# Patient Record
Sex: Female | Born: 1985 | Race: White | Hispanic: No | Marital: Married | Smoking: Never smoker
Health system: Southern US, Community
[De-identification: ages and names within clinical notes are randomized; demographics above are authoritative.]

## PROBLEM LIST (undated history)

## (undated) DIAGNOSIS — Z0000 Encounter for general adult medical examination without abnormal findings: Secondary | ICD-10-CM

## (undated) DIAGNOSIS — T7840XA Allergy, unspecified, initial encounter: Secondary | ICD-10-CM

## (undated) DIAGNOSIS — Z8619 Personal history of other infectious and parasitic diseases: Secondary | ICD-10-CM

## (undated) DIAGNOSIS — O30043 Twin pregnancy, dichorionic/diamniotic, third trimester: Secondary | ICD-10-CM

## (undated) HISTORY — DX: Personal history of other infectious and parasitic diseases: Z86.19

## (undated) HISTORY — DX: Allergy, unspecified, initial encounter: T78.40XA

## (undated) HISTORY — DX: Encounter for general adult medical examination without abnormal findings: Z00.00

## (undated) HISTORY — PX: DENTAL SURGERY: SHX609

---

## 2008-12-12 HISTORY — PX: BACK SURGERY: SHX140

## 2014-09-24 ENCOUNTER — Encounter: Payer: Self-pay | Admitting: Podiatry

## 2014-09-24 ENCOUNTER — Ambulatory Visit (INDEPENDENT_AMBULATORY_CARE_PROVIDER_SITE_OTHER): Payer: 59 | Admitting: Podiatry

## 2014-09-24 ENCOUNTER — Ambulatory Visit (INDEPENDENT_AMBULATORY_CARE_PROVIDER_SITE_OTHER): Payer: 59

## 2014-09-24 VITALS — BP 118/72 | HR 72 | Resp 13 | Ht 69.0 in | Wt 140.0 lb

## 2014-09-24 DIAGNOSIS — Q828 Other specified congenital malformations of skin: Secondary | ICD-10-CM

## 2014-09-24 DIAGNOSIS — M79673 Pain in unspecified foot: Secondary | ICD-10-CM

## 2014-09-24 NOTE — Progress Notes (Signed)
   Subjective:    Patient ID: Teresa Reeves, female    DOB: 05/13/1986, 28 y.o.   MRN: [id number]  HPI Comments: [name], 28 year old female, presents the office today for bilateral plantar "warts". She states they been ongoing for approximately 3 years. She's tried multiple over-the-counter therapy is without any resolution. She states that she has pain over the areas upon weightbearing and activity. Denies any history of injury or trauma to the area. No other complaints at this time.  Foot Pain      Review of Systems  All other systems reviewed and are negative.      Objective:   Physical Exam AAO x3, NAD DP/PT pulses palpable bilaterally, CRT less than 3 seconds Protective sensation intact with Simms Weinstein monofilament, vibratory sensation intact Bilateral submetatarsal 5 pinpoint hyperkeratotic lesions. Upon debridement there is no capillary bleeding or other evidence of verruca. There is hyperkeratotic tissue deep within the sites. No drainage, purulence, fluctuance, crepitus, erythema. No overlying edema or increased warmth. Submetatarsal 2 hyperkeratotic lesions. Mild decrease in dorsiflexion of the first MTPJ bilaterally. MMT 5/5, ROM WNL No calf pain, swelling, warmth.       Assessment & Plan:  28 year old female with bilateral submetatarsal 5 porokeratosis. -Treatment options discussed including alternatives, risks, complications. -Bilateral submetatarsal 5 lesions sharply debrided without complications. There is no evidence of rubra. -Salinocaine was applied with a surrounding pad and an overlying dressing. Patient was instructed on when to remove the bandages. Also discussed care for once the bandages are removed. -Followup as needed. Call the office with any questions, concerns, change in symptoms. Monitor for any signs or symptoms of infection and directed to call the office immediately if any are to occur.

## 2014-10-06 ENCOUNTER — Other Ambulatory Visit (HOSPITAL_COMMUNITY): Payer: Self-pay | Admitting: Obstetrics and Gynecology

## 2014-10-06 DIAGNOSIS — N979 Female infertility, unspecified: Secondary | ICD-10-CM

## 2014-10-09 ENCOUNTER — Ambulatory Visit (HOSPITAL_COMMUNITY)
Admission: RE | Admit: 2014-10-09 | Discharge: 2014-10-09 | Disposition: A | Discharge status: Home / Self Care | Payer: 59 | Source: Ambulatory Visit | Attending: Obstetrics and Gynecology | Admitting: Obstetrics and Gynecology

## 2014-10-09 DIAGNOSIS — N979 Female infertility, unspecified: Secondary | ICD-10-CM | POA: Diagnosis not present

## 2014-10-09 MED ORDER — IOHEXOL 300 MG/ML  SOLN
20.0000 mL | Freq: Once | INTRAMUSCULAR | Status: AC | PRN
  Administered 2014-10-09: 20 mL

## 2014-12-11 ENCOUNTER — Encounter: Payer: Self-pay | Admitting: Family Medicine

## 2014-12-11 ENCOUNTER — Ambulatory Visit (INDEPENDENT_AMBULATORY_CARE_PROVIDER_SITE_OTHER): Payer: 59 | Admitting: Family Medicine

## 2014-12-11 VITALS — BP 114/68 | HR 63 | Temp 98.3°F | Ht 69.75 in | Wt 133.4 lb

## 2014-12-11 DIAGNOSIS — M545 Low back pain: Secondary | ICD-10-CM

## 2014-12-11 DIAGNOSIS — T7840XA Allergy, unspecified, initial encounter: Secondary | ICD-10-CM

## 2014-12-11 DIAGNOSIS — Z0000 Encounter for general adult medical examination without abnormal findings: Secondary | ICD-10-CM

## 2014-12-11 DIAGNOSIS — Z23 Encounter for immunization: Secondary | ICD-10-CM

## 2014-12-11 DIAGNOSIS — R238 Other skin changes: Secondary | ICD-10-CM

## 2014-12-11 DIAGNOSIS — Z8619 Personal history of other infectious and parasitic diseases: Secondary | ICD-10-CM | POA: Insufficient documentation

## 2014-12-11 DIAGNOSIS — R233 Spontaneous ecchymoses: Secondary | ICD-10-CM

## 2014-12-11 DIAGNOSIS — M79605 Pain in left leg: Secondary | ICD-10-CM

## 2014-12-11 HISTORY — DX: Allergy, unspecified, initial encounter: T78.40XA

## 2014-12-11 HISTORY — DX: Encounter for general adult medical examination without abnormal findings: Z00.00

## 2014-12-11 LAB — COMPREHENSIVE METABOLIC PANEL
ALT: 31 U/L (ref 0–35)
AST: 34 U/L (ref 0–37)
Albumin: 4.7 g/dL (ref 3.5–5.2)
Alkaline Phosphatase: 101 U/L (ref 39–117)
BUN: 16 mg/dL (ref 6–23)
CALCIUM: 9.4 mg/dL (ref 8.4–10.5)
CHLORIDE: 106 meq/L (ref 96–112)
CO2: 28 mEq/L (ref 19–32)
CREATININE: 0.6 mg/dL (ref 0.4–1.2)
GFR: 121.79 mL/min (ref >60.00)
Glucose, Bld: 76 mg/dL (ref 70–99)
POTASSIUM: 3.8 meq/L (ref 3.5–5.1)
SODIUM: 138 meq/L (ref 135–145)
Total Bilirubin: 1 mg/dL (ref 0.2–1.2)
Total Protein: 7.5 g/dL (ref 6.0–8.3)

## 2014-12-11 LAB — LIPID PANEL
Cholesterol: 168 mg/dL (ref 0–200)
HDL: 73 mg/dL (ref >39.00)
LDL Cholesterol: 87 mg/dL (ref 0–99)
NONHDL: 95
Total CHOL/HDL Ratio: 2
Triglycerides: 40 mg/dL (ref 0.0–149.0)
VLDL: 8 mg/dL (ref 0.0–40.0)

## 2014-12-11 LAB — CBC
HCT: 41 % (ref 36.0–46.0)
HEMOGLOBIN: 13.9 g/dL (ref 12.0–15.0)
MCHC: 33.9 g/dL (ref 30.0–36.0)
MCV: 98.4 fl (ref 78.0–100.0)
Platelets: 231 10*3/uL (ref 150.0–400.0)
RBC: 4.16 Mil/uL (ref 3.87–5.11)
RDW: 12.4 % (ref 11.5–15.5)
WBC: 3.4 10*3/uL — AB (ref 4.0–10.5)

## 2014-12-11 LAB — TSH: TSH: 2.04 u[IU]/mL (ref 0.35–4.50)

## 2014-12-11 NOTE — Assessment & Plan Note (Signed)
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 Tolerable. Patient signed a ROR for [place] MRI on her s/p her surgery, he said it looked good. No c/o today

## 2014-12-11 NOTE — Assessment & Plan Note (Signed)
Encouraged Zyrte, nasal saline prn, no symptoms today

## 2014-12-11 NOTE — Progress Notes (Signed)
Pre visit review using our clinic review tool, if applicable. No additional management support is needed unless otherwise documented below in the visit note. 

## 2014-12-11 NOTE — Patient Instructions (Signed)
Preventive Care for Adults A healthy lifestyle and preventive care can promote health and wellness. Preventive health guidelines for women include the following key practices.  A routine yearly physical is a good way to check with your health care provider about your health and preventive screening. It is a chance to share any concerns and updates on your health and to receive a thorough exam.  Visit your dentist for a routine exam and preventive care every 6 months. Brush your teeth twice a day and floss once a day. Good oral hygiene prevents tooth decay and gum disease.  The frequency of eye exams is based on your age, health, family medical history, use of contact lenses, and other factors. Follow your health care provider's recommendations for frequency of eye exams.  Eat a healthy diet. Foods like vegetables, fruits, whole grains, low-fat dairy products, and lean protein foods contain the nutrients you need without too many calories. Decrease your intake of foods high in solid fats, added sugars, and salt. Eat the right amount of calories for you.Get information about a proper diet from your health care provider, if necessary.  Regular physical exercise is one of the most important things you can do for your health. Most adults should get at least 150 minutes of moderate-intensity exercise (any activity that increases your heart rate and causes you to sweat) each week. In addition, most adults need muscle-strengthening exercises on 2 or more days a week.  Maintain a healthy weight. The body mass index (BMI) is a screening tool to identify possible weight problems. It provides an estimate of body fat based on height and weight. Your health care provider can find your BMI and can help you achieve or maintain a healthy weight.For adults 20 years and older:  A BMI below 18.5 is considered underweight.  A BMI of 18.5 to 24.9 is normal.  A BMI of 25 to 29.9 is considered overweight.  A BMI of  30 and above is considered obese.  Maintain normal blood lipids and cholesterol levels by exercising and minimizing your intake of saturated fat. Eat a balanced diet with plenty of fruit and vegetables. Blood tests for lipids and cholesterol should begin at age 76 and be repeated every 5 years. If your lipid or cholesterol levels are high, you are over 50, or you are at high risk for heart disease, you may need your cholesterol levels checked more frequently.Ongoing high lipid and cholesterol levels should be treated with medicines if diet and exercise are not working.  If you smoke, find out from your health care provider how to quit. If you do not use tobacco, do not start.  Lung cancer screening is recommended for adults aged 22-80 years who are at high risk for developing lung cancer because of a history of smoking. A yearly low-dose CT scan of the lungs is recommended for people who have at least a 30-pack-year history of smoking and are a current smoker or have quit within the past 15 years. A pack year of smoking is smoking an average of 1 pack of cigarettes a day for 1 year (for example: 1 pack a day for 30 years or 2 packs a day for 15 years). Yearly screening should continue until the smoker has stopped smoking for at least 15 years. Yearly screening should be stopped for people who develop a health problem that would prevent them from having lung cancer treatment.  If you are pregnant, do not drink alcohol. If you are breastfeeding,  be very cautious about drinking alcohol. If you are not pregnant and choose to drink alcohol, do not have more than 1 drink per day. One drink is considered to be 12 ounces (355 mL) of beer, 5 ounces (148 mL) of wine, or 1.5 ounces (44 mL) of liquor.  Avoid use of street drugs. Do not share needles with anyone. Ask for help if you need support or instructions about stopping the use of drugs.  High blood pressure causes heart disease and increases the risk of  stroke. Your blood pressure should be checked at least every 1 to 2 years. Ongoing high blood pressure should be treated with medicines if weight loss and exercise do not work.  If you are 75-52 years old, ask your health care provider if you should take aspirin to prevent strokes.  Diabetes screening involves taking a blood sample to check your fasting blood sugar level. This should be done once every 3 years, after age 15, if you are within normal weight and without risk factors for diabetes. Testing should be considered at a younger age or be carried out more frequently if you are overweight and have at least 1 risk factor for diabetes.  Breast cancer screening is essential preventive care for women. You should practice "breast self-awareness." This means understanding the normal appearance and feel of your breasts and may include breast self-examination. Any changes detected, no matter how small, should be reported to a health care provider. Women in their 58s and 30s should have a clinical breast exam (CBE) by a health care provider as part of a regular health exam every 1 to 3 years. After age 16, women should have a CBE every year. Starting at age 53, women should consider having a mammogram (breast X-ray test) every year. Women who have a family history of breast cancer should talk to their health care provider about genetic screening. Women at a high risk of breast cancer should talk to their health care providers about having an MRI and a mammogram every year.  Breast cancer gene (BRCA)-related cancer risk assessment is recommended for women who have family members with BRCA-related cancers. BRCA-related cancers include breast, ovarian, tubal, and peritoneal cancers. Having family members with these cancers may be associated with an increased risk for harmful changes (mutations) in the breast cancer genes BRCA1 and BRCA2. Results of the assessment will determine the need for genetic counseling and  BRCA1 and BRCA2 testing.  Routine pelvic exams to screen for cancer are no longer recommended for nonpregnant women who are considered low risk for cancer of the pelvic organs (ovaries, uterus, and vagina) and who do not have symptoms. Ask your health care provider if a screening pelvic exam is right for you.  If you have had past treatment for cervical cancer or a condition that could lead to cancer, you need Pap tests and screening for cancer for at least 20 years after your treatment. If Pap tests have been discontinued, your risk factors (such as having a new sexual partner) need to be reassessed to determine if screening should be resumed. Some women have medical problems that increase the chance of getting cervical cancer. In these cases, your health care provider may recommend more frequent screening and Pap tests.  The HPV test is an additional test that may be used for cervical cancer screening. The HPV test looks for the virus that can cause the cell changes on the cervix. The cells collected during the Pap test can be  tested for HPV. The HPV test could be used to screen women aged 30 years and older, and should be used in women of any age who have unclear Pap test results. After the age of 30, women should have HPV testing at the same frequency as a Pap test.  Colorectal cancer can be detected and often prevented. Most routine colorectal cancer screening begins at the age of 50 years and continues through age 75 years. However, your health care provider may recommend screening at an earlier age if you have risk factors for colon cancer. On a yearly basis, your health care provider may provide home test kits to check for hidden blood in the stool. Use of a small camera at the end of a tube, to directly examine the colon (sigmoidoscopy or colonoscopy), can detect the earliest forms of colorectal cancer. Talk to your health care provider about this at age 50, when routine screening begins. Direct  exam of the colon should be repeated every 5-10 years through age 75 years, unless early forms of pre-cancerous polyps or small growths are found.  People who are at an increased risk for hepatitis B should be screened for this virus. You are considered at high risk for hepatitis B if:  You were born in a country where hepatitis B occurs often. Talk with your health care provider about which countries are considered high risk.  Your parents were born in a high-risk country and you have not received a shot to protect against hepatitis B (hepatitis B vaccine).  You have HIV or AIDS.  You use needles to inject street drugs.  You live with, or have sex with, someone who has hepatitis B.  You get hemodialysis treatment.  You take certain medicines for conditions like cancer, organ transplantation, and autoimmune conditions.  Hepatitis C blood testing is recommended for all people born from 1945 through 1965 and any individual with known risks for hepatitis C.  Practice safe sex. Use condoms and avoid high-risk sexual practices to reduce the spread of sexually transmitted infections (STIs). STIs include gonorrhea, chlamydia, syphilis, trichomonas, herpes, HPV, and human immunodeficiency virus (HIV). Herpes, HIV, and HPV are viral illnesses that have no cure. They can result in disability, cancer, and death.  You should be screened for sexually transmitted illnesses (STIs) including gonorrhea and chlamydia if:  You are sexually active and are younger than 24 years.  You are older than 24 years and your health care provider tells you that you are at risk for this type of infection.  Your sexual activity has changed since you were last screened and you are at an increased risk for chlamydia or gonorrhea. Ask your health care provider if you are at risk.  If you are at risk of being infected with HIV, it is recommended that you take a prescription medicine daily to prevent HIV infection. This is  called preexposure prophylaxis (PrEP). You are considered at risk if:  You are a heterosexual woman, are sexually active, and are at increased risk for HIV infection.  You take drugs by injection.  You are sexually active with a partner who has HIV.  Talk with your health care provider about whether you are at high risk of being infected with HIV. If you choose to begin PrEP, you should first be tested for HIV. You should then be tested every 3 months for as long as you are taking PrEP.  Osteoporosis is a disease in which the bones lose minerals and strength   with aging. This can result in serious bone fractures or breaks. The risk of osteoporosis can be identified using a bone density scan. Women ages 65 years and over and women at risk for fractures or osteoporosis should discuss screening with their health care providers. Ask your health care provider whether you should take a calcium supplement or vitamin D to reduce the rate of osteoporosis.  Menopause can be associated with physical symptoms and risks. Hormone replacement therapy is available to decrease symptoms and risks. You should talk to your health care provider about whether hormone replacement therapy is right for you.  Use sunscreen. Apply sunscreen liberally and repeatedly throughout the day. You should seek shade when your shadow is shorter than you. Protect yourself by wearing long sleeves, pants, a wide-brimmed hat, and sunglasses year round, whenever you are outdoors.  Once a month, do a whole body skin exam, using a mirror to look at the skin on your back. Tell your health care provider of new moles, moles that have irregular borders, moles that are larger than a pencil eraser, or moles that have changed in shape or color.  Stay current with required vaccines (immunizations).  Influenza vaccine. All adults should be immunized every year.  Tetanus, diphtheria, and acellular pertussis (Td, Tdap) vaccine. Pregnant women should  receive 1 dose of Tdap vaccine during each pregnancy. The dose should be obtained regardless of the length of time since the last dose. Immunization is preferred during the 27th-36th week of gestation. An adult who has not previously received Tdap or who does not know her vaccine status should receive 1 dose of Tdap. This initial dose should be followed by tetanus and diphtheria toxoids (Td) booster doses every 10 years. Adults with an unknown or incomplete history of completing a 3-dose immunization series with Td-containing vaccines should begin or complete a primary immunization series including a Tdap dose. Adults should receive a Td booster every 10 years.  Varicella vaccine. An adult without evidence of immunity to varicella should receive 2 doses or a second dose if she has previously received 1 dose. Pregnant females who do not have evidence of immunity should receive the first dose after pregnancy. This first dose should be obtained before leaving the health care facility. The second dose should be obtained 4-8 weeks after the first dose.  Human papillomavirus (HPV) vaccine. Females aged 13-26 years who have not received the vaccine previously should obtain the 3-dose series. The vaccine is not recommended for use in pregnant females. However, pregnancy testing is not needed before receiving a dose. If a female is found to be pregnant after receiving a dose, no treatment is needed. In that case, the remaining doses should be delayed until after the pregnancy. Immunization is recommended for any person with an immunocompromised condition through the age of 26 years if she did not get any or all doses earlier. During the 3-dose series, the second dose should be obtained 4-8 weeks after the first dose. The third dose should be obtained 24 weeks after the first dose and 16 weeks after the second dose.  Zoster vaccine. One dose is recommended for adults aged 60 years or older unless certain conditions are  present.  Measles, mumps, and rubella (MMR) vaccine. Adults born before 1957 generally are considered immune to measles and mumps. Adults born in 1957 or later should have 1 or more doses of MMR vaccine unless there is a contraindication to the vaccine or there is laboratory evidence of immunity to   each of the three diseases. A routine second dose of MMR vaccine should be obtained at least 28 days after the first dose for students attending postsecondary schools, health care workers, or international travelers. People who received inactivated measles vaccine or an unknown type of measles vaccine during 1963-1967 should receive 2 doses of MMR vaccine. People who received inactivated mumps vaccine or an unknown type of mumps vaccine before 1979 and are at high risk for mumps infection should consider immunization with 2 doses of MMR vaccine. For females of childbearing age, rubella immunity should be determined. If there is no evidence of immunity, females who are not pregnant should be vaccinated. If there is no evidence of immunity, females who are pregnant should delay immunization until after pregnancy. Unvaccinated health care workers born before 1957 who lack laboratory evidence of measles, mumps, or rubella immunity or laboratory confirmation of disease should consider measles and mumps immunization with 2 doses of MMR vaccine or rubella immunization with 1 dose of MMR vaccine.  Pneumococcal 13-valent conjugate (PCV13) vaccine. When indicated, a person who is uncertain of her immunization history and has no record of immunization should receive the PCV13 vaccine. An adult aged 19 years or older who has certain medical conditions and has not been previously immunized should receive 1 dose of PCV13 vaccine. This PCV13 should be followed with a dose of pneumococcal polysaccharide (PPSV23) vaccine. The PPSV23 vaccine dose should be obtained at least 8 weeks after the dose of PCV13 vaccine. An adult aged 19  years or older who has certain medical conditions and previously received 1 or more doses of PPSV23 vaccine should receive 1 dose of PCV13. The PCV13 vaccine dose should be obtained 1 or more years after the last PPSV23 vaccine dose.  Pneumococcal polysaccharide (PPSV23) vaccine. When PCV13 is also indicated, PCV13 should be obtained first. All adults aged 65 years and older should be immunized. An adult younger than age 65 years who has certain medical conditions should be immunized. Any person who resides in a nursing home or long-term care facility should be immunized. An adult smoker should be immunized. People with an immunocompromised condition and certain other conditions should receive both PCV13 and PPSV23 vaccines. People with human immunodeficiency virus (HIV) infection should be immunized as soon as possible after diagnosis. Immunization during chemotherapy or radiation therapy should be avoided. Routine use of PPSV23 vaccine is not recommended for American Indians, [place] Natives, or people younger than 65 years unless there are medical conditions that require PPSV23 vaccine. When indicated, people who have unknown immunization and have no record of immunization should receive PPSV23 vaccine. One-time revaccination 5 years after the first dose of PPSV23 is recommended for people aged 19-64 years who have chronic kidney failure, nephrotic syndrome, asplenia, or immunocompromised conditions. People who received 1-2 doses of PPSV23 before age 65 years should receive another dose of PPSV23 vaccine at age 65 years or later if at least 5 years have passed since the previous dose. Doses of PPSV23 are not needed for people immunized with PPSV23 at or after age 65 years.  Meningococcal vaccine. Adults with asplenia or persistent complement component deficiencies should receive 2 doses of quadrivalent meningococcal conjugate (MenACWY-D) vaccine. The doses should be obtained at least 2 months apart.  Microbiologists working with certain meningococcal bacteria, military recruits, people at risk during an outbreak, and people who travel to or live in countries with a high rate of meningitis should be immunized. A first-year college student up through age   21 years who is living in a residence hall should receive a dose if she did not receive a dose on or after her 16th birthday. Adults who have certain high-risk conditions should receive one or more doses of vaccine.  Hepatitis A vaccine. Adults who wish to be protected from this disease, have certain high-risk conditions, work with hepatitis A-infected animals, work in hepatitis A research labs, or travel to or work in countries with a high rate of hepatitis A should be immunized. Adults who were previously unvaccinated and who anticipate close contact with an international adoptee during the first 60 days after arrival in the [place] from a country with a high rate of hepatitis A should be immunized.  Hepatitis B vaccine. Adults who wish to be protected from this disease, have certain high-risk conditions, may be exposed to blood or other infectious body fluids, are household contacts or sex partners of hepatitis B positive people, are clients or workers in certain care facilities, or travel to or work in countries with a high rate of hepatitis B should be immunized.  Haemophilus influenzae type b (Hib) vaccine. A previously unvaccinated person with asplenia or sickle cell disease or having a scheduled splenectomy should receive 1 dose of Hib vaccine. Regardless of previous immunization, a recipient of a hematopoietic stem cell transplant should receive a 3-dose series 6-12 months after her successful transplant. Hib vaccine is not recommended for adults with HIV infection. Preventive Services / Frequency Ages 64 to 68 years  Blood pressure check.** / Every 1 to 2 years.  Lipid and cholesterol check.** / Every 5 years beginning at age  22.  Clinical breast exam.** / Every 3 years for women in their 88s and 53s.  BRCA-related cancer risk assessment.** / For women who have family members with a BRCA-related cancer (breast, ovarian, tubal, or peritoneal cancers).  Pap test.** / Every 2 years from ages 90 through 51. Every 3 years starting at age 21 through age 56 or 3 with a history of 3 consecutive normal Pap tests.  HPV screening.** / Every 3 years from ages 24 through ages 1 to 46 with a history of 3 consecutive normal Pap tests.  Hepatitis C blood test.** / For any individual with known risks for hepatitis C.  Skin self-exam. / Monthly.  Influenza vaccine. / Every year.  Tetanus, diphtheria, and acellular pertussis (Tdap, Td) vaccine.** / Consult your health care provider. Pregnant women should receive 1 dose of Tdap vaccine during each pregnancy. 1 dose of Td every 10 years.  Varicella vaccine.** / Consult your health care provider. Pregnant females who do not have evidence of immunity should receive the first dose after pregnancy.  HPV vaccine. / 3 doses over 6 months, if 72 and younger. The vaccine is not recommended for use in pregnant females. However, pregnancy testing is not needed before receiving a dose.  Measles, mumps, rubella (MMR) vaccine.** / You need at least 1 dose of MMR if you were born in 1957 or later. You may also need a 2nd dose. For females of childbearing age, rubella immunity should be determined. If there is no evidence of immunity, females who are not pregnant should be vaccinated. If there is no evidence of immunity, females who are pregnant should delay immunization until after pregnancy.  Pneumococcal 13-valent conjugate (PCV13) vaccine.** / Consult your health care provider.  Pneumococcal polysaccharide (PPSV23) vaccine.** / 1 to 2 doses if you smoke cigarettes or if you have certain conditions.  Meningococcal vaccine.** /  1 dose if you are age 19 to 21 years and a first-year college  student living in a residence hall, or have one of several medical conditions, you need to get vaccinated against meningococcal disease. You may also need additional booster doses.  Hepatitis A vaccine.** / Consult your health care provider.  Hepatitis B vaccine.** / Consult your health care provider.  Haemophilus influenzae type b (Hib) vaccine.** / Consult your health care provider. Ages 40 to 64 years  Blood pressure check.** / Every 1 to 2 years.  Lipid and cholesterol check.** / Every 5 years beginning at age 20 years.  Lung cancer screening. / Every year if you are aged 55-80 years and have a 30-pack-year history of smoking and currently smoke or have quit within the past 15 years. Yearly screening is stopped once you have quit smoking for at least 15 years or develop a health problem that would prevent you from having lung cancer treatment.  Clinical breast exam.** / Every year after age 40 years.  BRCA-related cancer risk assessment.** / For women who have family members with a BRCA-related cancer (breast, ovarian, tubal, or peritoneal cancers).  Mammogram.** / Every year beginning at age 40 years and continuing for as long as you are in good health. Consult with your health care provider.  Pap test.** / Every 3 years starting at age 30 years through age 65 or 70 years with a history of 3 consecutive normal Pap tests.  HPV screening.** / Every 3 years from ages 30 years through ages 65 to 70 years with a history of 3 consecutive normal Pap tests.  Fecal occult blood test (FOBT) of stool. / Every year beginning at age 50 years and continuing until age 75 years. You may not need to do this test if you get a colonoscopy every 10 years.  Flexible sigmoidoscopy or colonoscopy.** / Every 5 years for a flexible sigmoidoscopy or every 10 years for a colonoscopy beginning at age 50 years and continuing until age 75 years.  Hepatitis C blood test.** / For all people born from 1945 through  1965 and any individual with known risks for hepatitis C.  Skin self-exam. / Monthly.  Influenza vaccine. / Every year.  Tetanus, diphtheria, and acellular pertussis (Tdap/Td) vaccine.** / Consult your health care provider. Pregnant women should receive 1 dose of Tdap vaccine during each pregnancy. 1 dose of Td every 10 years.  Varicella vaccine.** / Consult your health care provider. Pregnant females who do not have evidence of immunity should receive the first dose after pregnancy.  Zoster vaccine.** / 1 dose for adults aged 60 years or older.  Measles, mumps, rubella (MMR) vaccine.** / You need at least 1 dose of MMR if you were born in 1957 or later. You may also need a 2nd dose. For females of childbearing age, rubella immunity should be determined. If there is no evidence of immunity, females who are not pregnant should be vaccinated. If there is no evidence of immunity, females who are pregnant should delay immunization until after pregnancy.  Pneumococcal 13-valent conjugate (PCV13) vaccine.** / Consult your health care provider.  Pneumococcal polysaccharide (PPSV23) vaccine.** / 1 to 2 doses if you smoke cigarettes or if you have certain conditions.  Meningococcal vaccine.** / Consult your health care provider.  Hepatitis A vaccine.** / Consult your health care provider.  Hepatitis B vaccine.** / Consult your health care provider.  Haemophilus influenzae type b (Hib) vaccine.** / Consult your health care provider. Ages 65   years and over  Blood pressure check.** / Every 1 to 2 years.  Lipid and cholesterol check.** / Every 5 years beginning at age 22 years.  Lung cancer screening. / Every year if you are aged 73-80 years and have a 30-pack-year history of smoking and currently smoke or have quit within the past 15 years. Yearly screening is stopped once you have quit smoking for at least 15 years or develop a health problem that would prevent you from having lung cancer  treatment.  Clinical breast exam.** / Every year after age 4 years.  BRCA-related cancer risk assessment.** / For women who have family members with a BRCA-related cancer (breast, ovarian, tubal, or peritoneal cancers).  Mammogram.** / Every year beginning at age 40 years and continuing for as long as you are in good health. Consult with your health care provider.  Pap test.** / Every 3 years starting at age 9 years through age 34 or 91 years with 3 consecutive normal Pap tests. Testing can be stopped between 65 and 70 years with 3 consecutive normal Pap tests and no abnormal Pap or HPV tests in the past 10 years.  HPV screening.** / Every 3 years from ages 57 years through ages 64 or 45 years with a history of 3 consecutive normal Pap tests. Testing can be stopped between 65 and 70 years with 3 consecutive normal Pap tests and no abnormal Pap or HPV tests in the past 10 years.  Fecal occult blood test (FOBT) of stool. / Every year beginning at age 15 years and continuing until age 17 years. You may not need to do this test if you get a colonoscopy every 10 years.  Flexible sigmoidoscopy or colonoscopy.** / Every 5 years for a flexible sigmoidoscopy or every 10 years for a colonoscopy beginning at age 86 years and continuing until age 71 years.  Hepatitis C blood test.** / For all people born from [birth year] through 1967 and any individual with known risks for hepatitis C.  Osteoporosis screening.** / A one-time screening for women ages 83 years and over and women at risk for fractures or osteoporosis.  Skin self-exam. / Monthly.  Influenza vaccine. / Every year.  Tetanus, diphtheria, and acellular pertussis (Tdap/Td) vaccine.** / 1 dose of Td every 10 years.  Varicella vaccine.** / Consult your health care provider.  Zoster vaccine.** / 1 dose for adults aged 61 years or older.  Pneumococcal 13-valent conjugate (PCV13) vaccine.** / Consult your health care provider.  Pneumococcal  polysaccharide (PPSV23) vaccine.** / 1 dose for all adults aged 28 years and older.  Meningococcal vaccine.** / Consult your health care provider.  Hepatitis A vaccine.** / Consult your health care provider.  Hepatitis B vaccine.** / Consult your health care provider.  Haemophilus influenzae type b (Hib) vaccine.** / Consult your health care provider. ** Family history and personal history of risk and conditions may change your health care provider's recommendations. Document Released: 01/24/2002 Document Revised: 04/14/2014 Document Reviewed: 04/25/2011 [facility] Patient Information 2015 [place], [place]. This information is not intended to replace advice given to you by your health care provider. Make sure you discuss any questions you have with your health care provider.

## 2014-12-11 NOTE — Assessment & Plan Note (Signed)
Patient encouraged to maintain heart healthy diet, regular exercise, adequate sleep. Consider daily probiotics. Fasting labs obtained today. C/o some easy bruising but no recent change will check a CBC. She is considering a pregnancy has been following with [facility] but her Dr, Dr Kathie RhodesHaygod does not do deliveries any longer. She will pursue care and is taking a prenatal vitamin.

## 2014-12-11 NOTE — Progress Notes (Signed)
Reino BellisMelanie Kensinger  [id number] 11/13/1986 12/11/2014      Progress Note-Follow Up  Subjective  Chief Complaint  Chief Complaint  Patient presents with  . Establish Care    pt wanrt to discuss bruising easily    HPI  Patient is a 28 y.o. female in today for routine medical care. Doing well, no recent illness. She is here to establish care, has moved here from [place]. C/o occasional allergic symptoms and sinusitis but none recently. Has a h/o low back injury requiring surgery but is not in pain today. Denies CP/palp/SOB/HA/congestion/fevers/GI or GU c/o. Taking meds as prescribed  History reviewed. No pertinent past medical history.  Past Surgical History  Procedure Laterality Date  . Back surgery  2010  . Dental surgery      Family History  Problem Relation Age of Onset  . Hypertension Mother   . Hypertension Father   . Leukemia Maternal Grandmother     History   Social History  . Marital Status: Married    Spouse Name: N/A    Number of Children: N/A  . Years of Education: N/A   Occupational History  . Not on file.   Social History Main Topics  . Smoking status: Never Smoker   . Smokeless tobacco: Not on file  . Alcohol Use: Not on file  . Drug Use: Not on file  . Sexual Activity: Not on file   Other Topics Concern  . Not on file   Social History Narrative    No current outpatient prescriptions on file prior to visit.   No current facility-administered medications on file prior to visit.    No Known Allergies  Review of Systems  Review of Systems  Constitutional: Negative for fever, chills and malaise/fatigue.  HENT: Negative for congestion, hearing loss and nosebleeds.   Eyes: Negative for discharge.  Respiratory: Negative for cough, sputum production, shortness of breath and wheezing.   Cardiovascular: Negative for chest pain, palpitations and leg swelling.  Gastrointestinal: Negative for heartburn, nausea, vomiting, abdominal pain, diarrhea,  constipation and blood in stool.  Genitourinary: Negative for dysuria, urgency, frequency and hematuria.  Musculoskeletal: Negative for myalgias, back pain and falls.  Skin: Negative for rash.  Neurological: Negative for dizziness, tremors, sensory change, focal weakness, loss of consciousness, weakness and headaches.  Endo/Heme/Allergies: Negative for polydipsia. Bruises/bleeds easily.  Psychiatric/Behavioral: Negative for depression and suicidal ideas. The patient is not nervous/anxious and does not have insomnia.     Objective  BP 114/68 mmHg  Pulse 63  Temp(Src) 98.3 F (36.8 C) (Oral)  Ht 5' 9.75" (1.772 m)  Wt 133 lb 6.4 oz (60.51 kg)  BMI 19.27 kg/m2  SpO2 100%  LMP 12/05/2014  Physical Exam  Physical Exam  Constitutional: She is oriented to person, place, and time and well-developed, well-nourished, and in no distress. No distress.  HENT:  Head: Normocephalic and atraumatic.  Right Ear: External ear normal.  Left Ear: External ear normal.  Nose: Nose normal.  Mouth/Throat: Oropharynx is clear and moist. No oropharyngeal exudate.  Eyes: Conjunctivae are normal. Pupils are equal, round, and reactive to light. Right eye exhibits no discharge. Left eye exhibits no discharge. No scleral icterus.  Neck: Normal range of motion. Neck supple. No thyromegaly present.  Cardiovascular: Normal rate, regular rhythm, normal heart sounds and intact distal pulses.   No murmur heard. Pulmonary/Chest: Effort normal and breath sounds normal. No respiratory distress. She has no wheezes. She has no rales.  Abdominal: Soft. Bowel sounds are  normal. She exhibits no distension and no mass. There is no tenderness.  Musculoskeletal: Normal range of motion. She exhibits no edema or tenderness.  Lymphadenopathy:    She has no cervical adenopathy.  Neurological: She is alert and oriented to person, place, and time. She has normal reflexes. No cranial nerve deficit. Coordination normal.  Skin: Skin  is warm and dry. No rash noted. She is not diaphoretic.  Psychiatric: Mood, memory and affect normal.    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 Tolerable. Patient signed a ROR for Dr Lucienne MinksKillefford in [place] who did an MRI on her s/p her surgery, he said it looked good. No c/o today  Allergic state Encouraged Zyrte, nasal saline prn, no symptoms today   Preventative health care Patient encouraged to maintain heart healthy diet, regular exercise, adequate sleep. Consider daily probiotics. Fasting labs obtained today. C/o some easy bruising but no recent change will check a CBC. She is considering a pregnancy has been following with [facility] but her Dr, Dr Kathie RhodesHaygod does not do deliveries any longer. She will pursue care and is taking a prenatal vitamin.

## 2015-04-24 LAB — OB RESULTS CONSOLE RPR: RPR: NONREACTIVE

## 2015-04-24 LAB — OB RESULTS CONSOLE RUBELLA ANTIBODY, IGM: RUBELLA: IMMUNE

## 2015-04-24 LAB — OB RESULTS CONSOLE ABO/RH: RH Type: POSITIVE

## 2015-04-24 LAB — OB RESULTS CONSOLE HEPATITIS B SURFACE ANTIGEN: HEP B S AG: NEGATIVE

## 2015-04-24 LAB — OB RESULTS CONSOLE HIV ANTIBODY (ROUTINE TESTING): HIV: NONREACTIVE

## 2015-04-24 LAB — OB RESULTS CONSOLE ANTIBODY SCREEN: Antibody Screen: NEGATIVE

## 2015-05-04 LAB — OB RESULTS CONSOLE GC/CHLAMYDIA
CHLAMYDIA, DNA PROBE: NEGATIVE
GC PROBE AMP, GENITAL: NEGATIVE

## 2015-10-09 ENCOUNTER — Ambulatory Visit (INDEPENDENT_AMBULATORY_CARE_PROVIDER_SITE_OTHER): Payer: 59 | Admitting: *Deleted

## 2015-10-09 VITALS — BP 125/73 | HR 67

## 2015-10-09 DIAGNOSIS — O30043 Twin pregnancy, dichorionic/diamniotic, third trimester: Secondary | ICD-10-CM | POA: Diagnosis not present

## 2015-10-09 NOTE — Progress Notes (Signed)
Copy of report and NST tracing sent to Dr. Cherly Hensenousins w/pt today.

## 2015-10-16 ENCOUNTER — Inpatient Hospital Stay (HOSPITAL_COMMUNITY)
Admission: AD | Admit: 2015-10-16 | Discharge: 2015-10-16 | Disposition: A | Discharge status: Home / Self Care | Payer: 59 | Source: Ambulatory Visit | Attending: Obstetrics and Gynecology | Admitting: Obstetrics and Gynecology

## 2015-10-16 ENCOUNTER — Inpatient Hospital Stay (HOSPITAL_COMMUNITY): Payer: 59

## 2015-10-16 ENCOUNTER — Encounter (HOSPITAL_COMMUNITY): Payer: Self-pay | Admitting: Obstetrics and Gynecology

## 2015-10-16 DIAGNOSIS — O30043 Twin pregnancy, dichorionic/diamniotic, third trimester: Secondary | ICD-10-CM

## 2015-10-16 DIAGNOSIS — Z3A34 34 weeks gestation of pregnancy: Secondary | ICD-10-CM | POA: Insufficient documentation

## 2015-10-16 DIAGNOSIS — O26873 Cervical shortening, third trimester: Secondary | ICD-10-CM

## 2015-10-16 DIAGNOSIS — O30003 Twin pregnancy, unspecified number of placenta and unspecified number of amniotic sacs, third trimester: Secondary | ICD-10-CM

## 2015-10-16 HISTORY — DX: Twin pregnancy, dichorionic/diamniotic, third trimester: O30.043

## 2015-10-16 LAB — OB RESULTS CONSOLE GBS: GBS: POSITIVE

## 2015-10-16 MED ORDER — BETAMETHASONE SOD PHOS & ACET 6 (3-3) MG/ML IJ SUSP
12.0000 mg | INTRAMUSCULAR | Status: DC
  Administered 2015-10-16: 12 mg via INTRAMUSCULAR
  Filled 2015-10-16: qty 2

## 2015-10-16 NOTE — MAU Note (Signed)
Pt sent from office at 1 cm dilated for betamethasone injection, monitoring and US.

## 2015-10-16 NOTE — Discharge Instructions (Signed)
Return to MAU for betamethasone tomorrow at 4pm  Preterm Labor Information Preterm labor is when labor starts at less than 37 weeks of pregnancy. The normal length of a pregnancy is 39 to 41 weeks. CAUSES Often, there is no identifiable underlying cause as to why a woman goes into preterm labor. One of the most common known causes of preterm labor is infection. Infections of the uterus, cervix, vagina, amniotic sac, bladder, kidney, or even the lungs (pneumonia) can cause labor to start. Other suspected causes of preterm labor include:   Urogenital infections, such as yeast infections and bacterial vaginosis.   Uterine abnormalities (uterine shape, uterine septum, fibroids, or bleeding from the placenta).   A cervix that has been operated on (it may fail to stay closed).   Malformations in the fetus.   Multiple gestations (twins, triplets, and so on).   Breakage of the amniotic sac.  RISK FACTORS  Having a previous history of preterm labor.   Having premature rupture of membranes (PROM).   Having a placenta that covers the opening of the cervix (placenta previa).   Having a placenta that separates from the uterus (placental abruption).   Having a cervix that is too weak to hold the fetus in the uterus (incompetent cervix).   Having too much fluid in the amniotic sac (polyhydramnios).   Taking illegal drugs or smoking while pregnant.   Not gaining enough weight while pregnant.   Being younger than 5418 and older than 31 years old.   Having a low socioeconomic status.   Being African American. SYMPTOMS Signs and symptoms of preterm labor include:   Menstrual-like cramps, abdominal pain, or back pain.  Uterine contractions that are regular, as frequent as six in an hour, regardless of their intensity (may be mild or painful).  Contractions that start on the top of the uterus and spread down to the lower abdomen and back.   A sense of increased pelvic  pressure.   A watery or bloody mucus discharge that comes from the vagina.  TREATMENT Depending on the length of the pregnancy and other circumstances, your health care provider may suggest bed rest. If necessary, there are medicines that can be given to stop contractions and to mature the fetal lungs. If labor happens before 34 weeks of pregnancy, a prolonged hospital stay may be recommended. Treatment depends on the condition of both you and the fetus.  WHAT SHOULD YOU DO IF YOU THINK YOU ARE IN PRETERM LABOR? Call your health care provider right away. You will need to go to the hospital to get checked immediately. HOW CAN YOU PREVENT PRETERM LABOR IN FUTURE PREGNANCIES? You should:   Stop smoking if you smoke.  Maintain healthy weight gain and avoid chemicals and drugs that are not necessary.  Be watchful for any type of infection.  Inform your health care provider if you have a known history of preterm labor.   This information is not intended to replace advice given to you by your health care provider. Make sure you discuss any questions you have with your health care provider.   Document Released: 02/18/2004 Document Revised: 07/31/2013 Document Reviewed: 12/31/2012 Elsevier Interactive Patient Education [employer]2016 Elsevier Inc.

## 2015-10-16 NOTE — MAU Provider Note (Signed)
History     Chief Complaint  Patient presents with  . Contractions   Here for eval due to cervical dilation noted on routine OB visit with NST x 2 and irreg ctx not perceived by pt  OB History    Gravida Para Term Preterm AB TAB SAB Ectopic Multiple Living   1               Past Medical History  Diagnosis Date  . History of chicken pox   . Allergic state 12/11/2014  . Preventative health care 12/11/2014  . Dichorionic diamniotic twin pregnancy in third trimester 10/16/2015    Past Surgical History  Procedure Laterality Date  . 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 Dental surgery      Family History  Problem Relation Age of Onset  . Hypertension Mother   . Hypertension Father   . Gout Father   . Leukemia Maternal Grandmother   . Cancer Maternal Grandmother     Leukemia  . Heart disease Maternal Grandmother     defibrillator,pacer  . Dementia Maternal Grandfather     Social History  Substance Use Topics  . Smoking status: Never Smoker   . Smokeless tobacco: None  . Alcohol Use: Yes     Comment: rare    Allergies: No Known Allergies  Prescriptions prior to admission  Medication Sig Dispense Refill Last Dose  . folic acid (FOLVITE) 400 MCG tablet Take 800 mcg by mouth daily.   10/16/2015 at Unknown time  . Magnesium 250 MG TABS Take 1 tablet by mouth daily.   10/16/2015 at Unknown time  . Prenatal Vit-Fe Fumarate-FA (PRENATAL MULTIVITAMIN) TABS tablet Take 1 tablet by mouth daily at 12 noon.   10/16/2015 at Unknown time     Physical Exam   Blood pressure 130/75, pulse 65, temperature 98.6 F (37 C), resp. rate 18, last menstrual period 02/16/2015.  BP 130/75 mmHg  Pulse 65  Temp(Src) 98.6 F (37 C)  Resp 18  LMP 02/16/2015 General appearance: alert, cooperative and no distress Abdomen: gravid nontender Pelvic: external genitalia normal and cervix 1/80/-2  post Extremities: edema trace ED Course  GBS cx done Sono: breech/vtx. Formal result still pending IMP: PTL @ 34 5/7 weeks twin( di-di)_. Stable cervix P) repeat BMZ tomorrow. PTL prec until Sunday( 35 wks). Labor prec thereafter. Disc if SROm occurs need to come To hosp immediately given the presentation. Plan C/S. Keep OB appt  MDM   Nanetta Wiegman A, MD 6:30 PM 10/16/2015

## 2015-10-16 NOTE — MAU Provider Note (Signed)
  History     CSN: [id number]  Arrival date and time: 10/16/15 1537  Provider on unit: 1618 Provider at bedside: 1620     Chief Complaint  Patient presents with  . Contractions   HPI  [name] is a 29 yo G2P0010 female at 34.5 wks with di-di twins by U/S, presenting from office for contractions and cervical change.  She was seen by Dr. Cherly Hensenousins and had an NST in the office. 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  Past Medical History  Diagnosis Date  . History of chicken pox   . Allergic state 12/11/2014  . Preventative health care 12/11/2014  . Dichorionic diamniotic twin pregnancy in third trimester 10/16/2015    Past Surgical History  Procedure Laterality Date  . 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 Dental surgery      Family History  Problem Relation Age of Onset  . Hypertension Mother   . Hypertension Father   . Gout Father   . Leukemia Maternal Grandmother   . Cancer Maternal Grandmother     Leukemia  . Heart disease Maternal Grandmother     defibrillator,pacer  . Dementia Maternal Grandfather     Social History  Substance Use Topics  . Smoking status: Never Smoker   . Smokeless tobacco: Not on file  . Alcohol Use: Yes     Comment: rare    Allergies: No Known Allergies  Prescriptions prior to admission  Medication Sig Dispense Refill Last Dose  . folic acid (FOLVITE) 400 MCG tablet Take 800 mcg by mouth daily.   10/16/2015 at Unknown time  . Magnesium 250 MG TABS Take 1 tablet by mouth daily.   10/16/2015 at Unknown time  . Prenatal Vit-Fe Fumarate-FA (PRENATAL MULTIVITAMIN) TABS tablet Take 1 tablet by mouth daily at 12 noon.   10/16/2015 at Unknown time    Review of Systems  Constitutional: Negative.   HENT: Negative.   Eyes: Negative.   Respiratory: Negative.    Cardiovascular: Negative.   Gastrointestinal: Negative.   Genitourinary:       Some cramping  Musculoskeletal: Negative.   Skin: Negative.   Neurological: Negative.   Endo/Heme/Allergies: Negative.   Psychiatric/Behavioral: Negative.    CEFM  FHR (A): 145 bpm / moderate variability / accels present / decels absent FHR (B): 150 bpm / moderate variability / accels present / decels absent TOCO: regular every 2-3.5 mins  Physical Exam   Filed Vitals:   10/16/15 1615  BP: 130/75  Pulse: 65  Temp: 98.6 F (37 C)  Resp: 18    Last menstrual period 02/16/2015.  Physical Exam  Constitutional: She is oriented to person, place, and time. She appears well-developed and well-nourished.  Genitourinary:  Pelvic deferred; VE in office  Neurological: She is alert and oriented to person, place, and time.  Psychiatric: She has a normal mood and affect. Her behavior is normal. Judgment and thought content normal.    MAU Course  Procedures CEFM BMZ 12 mg IM (1st dose) GBS OB Complete U/S >14 wks (di-di twins)  Assessment and Plan  G2P0010 at 34.[redacted] wks gestation Di-Di Twin gestation Category 1 FHR tracing x 2 Preterm Contractions  Care assumed by Dr. Cherly Hensenousins after return from u/s dept  Teresa Reeves, Teresa Reeves, M MSN, CNM 10/16/2015, 4:48 PM

## 2015-10-17 ENCOUNTER — Inpatient Hospital Stay (HOSPITAL_COMMUNITY)
Admission: AD | Admit: 2015-10-17 | Discharge: 2015-10-17 | Disposition: A | Discharge status: Home / Self Care | Payer: 59 | Source: Ambulatory Visit | Attending: Obstetrics and Gynecology | Admitting: Obstetrics and Gynecology

## 2015-10-17 DIAGNOSIS — Z3A28 28 weeks gestation of pregnancy: Secondary | ICD-10-CM | POA: Diagnosis not present

## 2015-10-17 LAB — CULTURE, BETA STREP (GROUP B ONLY)

## 2015-10-17 MED ORDER — BETAMETHASONE SOD PHOS & ACET 6 (3-3) MG/ML IJ SUSP
12.0000 mg | Freq: Once | INTRAMUSCULAR | Status: AC
  Administered 2015-10-17: 12 mg via INTRAMUSCULAR
  Filled 2015-10-17: qty 2

## 2015-10-17 NOTE — MAU Note (Signed)
Pt presents to MAU for 2nd steroid injection, betamethasone. Denies any vaginal bleeding or LOF

## 2015-10-23 ENCOUNTER — Other Ambulatory Visit: Payer: Self-pay | Admitting: Obstetrics and Gynecology

## 2015-10-23 ENCOUNTER — Inpatient Hospital Stay (HOSPITAL_COMMUNITY)
Admission: AD | Admit: 2015-10-23 | Discharge: 2015-10-27 | DRG: 765 | Disposition: A | Discharge status: Home / Self Care | Payer: 59 | Source: Ambulatory Visit | Attending: Obstetrics and Gynecology | Admitting: Obstetrics and Gynecology

## 2015-10-23 ENCOUNTER — Inpatient Hospital Stay (HOSPITAL_COMMUNITY): Payer: 59

## 2015-10-23 ENCOUNTER — Encounter (HOSPITAL_COMMUNITY): Payer: Self-pay

## 2015-10-23 DIAGNOSIS — O321XX0 Maternal care for breech presentation, not applicable or unspecified: Secondary | ICD-10-CM | POA: Diagnosis present

## 2015-10-23 DIAGNOSIS — Z8249 Family history of ischemic heart disease and other diseases of the circulatory system: Secondary | ICD-10-CM

## 2015-10-23 DIAGNOSIS — Z3A35 35 weeks gestation of pregnancy: Secondary | ICD-10-CM

## 2015-10-23 DIAGNOSIS — Z8269 Family history of other diseases of the musculoskeletal system and connective tissue: Secondary | ICD-10-CM

## 2015-10-23 DIAGNOSIS — Z832 Family history of diseases of the blood and blood-forming organs and certain disorders involving the immune mechanism: Secondary | ICD-10-CM

## 2015-10-23 DIAGNOSIS — O42913 Preterm premature rupture of membranes, unspecified as to length of time between rupture and onset of labor, third trimester: Principal | ICD-10-CM | POA: Diagnosis present

## 2015-10-23 DIAGNOSIS — O329XX0 Maternal care for malpresentation of fetus, unspecified, not applicable or unspecified: Secondary | ICD-10-CM

## 2015-10-23 DIAGNOSIS — O30043 Twin pregnancy, dichorionic/diamniotic, third trimester: Secondary | ICD-10-CM | POA: Diagnosis present

## 2015-10-23 DIAGNOSIS — Z809 Family history of malignant neoplasm, unspecified: Secondary | ICD-10-CM

## 2015-10-23 DIAGNOSIS — Z810 Family history of intellectual disabilities: Secondary | ICD-10-CM

## 2015-10-23 DIAGNOSIS — N838 Other noninflammatory disorders of ovary, fallopian tube and broad ligament: Secondary | ICD-10-CM | POA: Diagnosis present

## 2015-10-23 DIAGNOSIS — O322XX0 Maternal care for transverse and oblique lie, not applicable or unspecified: Secondary | ICD-10-CM | POA: Diagnosis present

## 2015-10-23 DIAGNOSIS — O99824 Streptococcus B carrier state complicating childbirth: Secondary | ICD-10-CM | POA: Diagnosis present

## 2015-10-23 LAB — CBC
HCT: 37.2 % (ref 36.0–46.0)
HEMOGLOBIN: 12.9 g/dL (ref 12.0–15.0)
MCH: 34.1 pg — ABNORMAL HIGH (ref 26.0–34.0)
MCHC: 34.7 g/dL (ref 30.0–36.0)
MCV: 98.4 fL (ref 78.0–100.0)
PLATELETS: 218 10*3/uL (ref 150–400)
RBC: 3.78 MIL/uL — AB (ref 3.87–5.11)
RDW: 12.1 % (ref 11.5–15.5)
WBC: 10.3 10*3/uL (ref 4.0–10.5)

## 2015-10-23 LAB — POCT FERN TEST: POCT Fern Test: POSITIVE

## 2015-10-23 MED ORDER — CITRIC ACID-SODIUM CITRATE 334-500 MG/5ML PO SOLN
30.0000 mL | Freq: Once | ORAL | Status: AC
  Administered 2015-10-24: 30 mL via ORAL
  Filled 2015-10-23: qty 15

## 2015-10-23 MED ORDER — CEFAZOLIN SODIUM-DEXTROSE 2-3 GM-% IV SOLR
2.0000 g | INTRAVENOUS | Status: AC
  Administered 2015-10-24: 2 g via INTRAVENOUS
  Filled 2015-10-23: qty 50

## 2015-10-23 MED ORDER — LACTATED RINGERS IV SOLN
INTRAVENOUS | Status: DC
  Administered 2015-10-24 (×3): via INTRAVENOUS

## 2015-10-23 MED ORDER — FAMOTIDINE IN NACL 20-0.9 MG/50ML-% IV SOLN
20.0000 mg | Freq: Once | INTRAVENOUS | Status: AC
  Administered 2015-10-23: 20 mg via INTRAVENOUS
  Filled 2015-10-23: qty 50

## 2015-10-23 MED ORDER — LACTATED RINGERS IV BOLUS (SEPSIS)
1000.0000 mL | Freq: Once | INTRAVENOUS | Status: AC
  Administered 2015-10-23: 1000 mL via INTRAVENOUS

## 2015-10-23 NOTE — MAU Note (Signed)
SROM about 2015. Mostly clear with some pink tinge. 1cm this am.

## 2015-10-23 NOTE — H&P (Signed)
Teresa Reeves is a 29 y.o. female G1P0 MWF @ 1135 5/7 weeks  Twin gestation  presenting for admission due to SROM @ 8:15 pm clear fluid and early labor. Pt had BMZ done 11/4, 11/5 due to preterm cervical change. GBS cx (+) . Concordant growth  Maternal Medical History:  Reason for admission: Rupture of membranes.   Contractions: Onset was 3-5 hours ago.   Frequency: regular.   Perceived severity is moderate.    Fetal activity: Perceived fetal activity is normal.    Prenatal complications: Twins( di-di)    OB History    Gravida Para Term Preterm AB TAB SAB Ectopic Multiple Living   1              Past Medical History  Diagnosis Date  . History of chicken pox   . Allergic state 12/11/2014  . Preventative health care 12/11/2014  . Dichorionic diamniotic twin pregnancy in third trimester 10/16/2015   Past Surgical History  Procedure Laterality Date  . 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 Dental surgery     Family History: family history includes Cancer in her maternal grandmother; Dementia in her maternal grandfather; Gout in her father; Heart disease in her maternal grandmother; Hypertension in her father and mother; Leukemia in her maternal grandmother. Social History:  reports that she has never smoked. She does not have any smokeless tobacco history on file. She reports that she drinks alcohol. She reports that she does not use illicit drugs.   Prenatal Transfer Tool  Maternal Diabetes: No Genetic Screening: Declined Maternal Ultrasounds/Referrals: Normal Fetal Ultrasounds or other Referrals:  None Maternal Substance Abuse:  No Significant Maternal Medications:  None Significant Maternal Lab Results:  Lab values include: Group B Strep positive Other Comments:  BMZ complete 11/4, 11/5, Di-di twins( IVF)  Review of Systems  All other systems reviewed and are  negative.   Dilation: 1.5 Effacement (%): 80 Station: -2 Exam by:: [employer] OmahaBenji Stanley RN Blood pressure 135/63, pulse 81, temperature 98.3 F (36.8 C), temperature source Oral, resp. rate 16, height 5\' 9"  (1.753 m), weight 84.46 kg (186 lb 3.2 oz), last menstrual period 02/16/2015. Maternal Exam:  Uterine Assessment: Contraction strength is moderate.  Abdomen: Patient reports no abdominal tenderness. Fetal presentation: breech  Introitus: Normal vulva. Ferning test: positive.  Amniotic fluid character: clear.  Cervix: Cervix evaluated by digital exam.     Physical Exam  Constitutional: She is oriented to person, place, and time. She appears well-developed and well-nourished.  HENT:  Head: Normocephalic and atraumatic.  Eyes: EOM are normal.  Neck: Neck supple.  Cardiovascular: Regular rhythm.   Respiratory: Breath sounds normal.  GI: Soft.  Musculoskeletal: She exhibits edema.  Neurological: She is alert and oriented to person, place, and time.  Skin: Skin is warm and dry.  Psychiatric: She has a normal mood and affect.   VE 2/80/ breech clear fluid  Prenatal labs: ABO, Rh: O/Positive/-- (05/13 0000) Antibody: Negative (05/13 0000) Rubella: Immune (05/13 0000) RPR: Nonreactive (05/13 0000)  HBsAg: Negative (05/13 0000)  HIV: Non-reactive (05/13 0000)  GBS: Positive (11/04 0000)   Assessment/Plan: PPROM IVF  Di-Di Twins @ 35 5/7 weeks Breech/vtx Labor P) Primary Cesarean section. Risk of surgery reviewed including infection, bleeding, poss blood transfusion and its risk( hiv, hepatitis, fever/rash), injury to surrounding organ structures, internal scar tissue. All ? Answered. Consent signed. Routine  labs. Admission.  Pammie Chirino A 10/23/2015, 11:50 PM

## 2015-10-24 ENCOUNTER — Inpatient Hospital Stay (HOSPITAL_COMMUNITY): Payer: 59 | Admitting: Anesthesiology

## 2015-10-24 ENCOUNTER — Encounter (HOSPITAL_COMMUNITY): Payer: Self-pay | Admitting: Anesthesiology

## 2015-10-24 ENCOUNTER — Encounter (HOSPITAL_COMMUNITY)
Admission: AD | Disposition: A | Discharge status: Home / Self Care | Payer: Self-pay | Source: Ambulatory Visit | Attending: Obstetrics and Gynecology

## 2015-10-24 DIAGNOSIS — O322XX0 Maternal care for transverse and oblique lie, not applicable or unspecified: Secondary | ICD-10-CM | POA: Diagnosis present

## 2015-10-24 DIAGNOSIS — O42913 Preterm premature rupture of membranes, unspecified as to length of time between rupture and onset of labor, third trimester: Secondary | ICD-10-CM | POA: Diagnosis present

## 2015-10-24 DIAGNOSIS — Z3A35 35 weeks gestation of pregnancy: Secondary | ICD-10-CM | POA: Diagnosis not present

## 2015-10-24 DIAGNOSIS — Z809 Family history of malignant neoplasm, unspecified: Secondary | ICD-10-CM | POA: Diagnosis not present

## 2015-10-24 DIAGNOSIS — Z8249 Family history of ischemic heart disease and other diseases of the circulatory system: Secondary | ICD-10-CM | POA: Diagnosis not present

## 2015-10-24 DIAGNOSIS — O321XX0 Maternal care for breech presentation, not applicable or unspecified: Secondary | ICD-10-CM | POA: Diagnosis present

## 2015-10-24 DIAGNOSIS — Z810 Family history of intellectual disabilities: Secondary | ICD-10-CM | POA: Diagnosis not present

## 2015-10-24 DIAGNOSIS — N838 Other noninflammatory disorders of ovary, fallopian tube and broad ligament: Secondary | ICD-10-CM | POA: Diagnosis present

## 2015-10-24 DIAGNOSIS — O99824 Streptococcus B carrier state complicating childbirth: Secondary | ICD-10-CM | POA: Diagnosis present

## 2015-10-24 DIAGNOSIS — O30043 Twin pregnancy, dichorionic/diamniotic, third trimester: Secondary | ICD-10-CM | POA: Diagnosis present

## 2015-10-24 DIAGNOSIS — Z8269 Family history of other diseases of the musculoskeletal system and connective tissue: Secondary | ICD-10-CM | POA: Diagnosis not present

## 2015-10-24 DIAGNOSIS — Z832 Family history of diseases of the blood and blood-forming organs and certain disorders involving the immune mechanism: Secondary | ICD-10-CM | POA: Diagnosis not present

## 2015-10-24 LAB — TYPE AND SCREEN
ABO/RH(D): O POS
ANTIBODY SCREEN: NEGATIVE

## 2015-10-24 LAB — RPR: RPR Ser Ql: NONREACTIVE

## 2015-10-24 LAB — ABO/RH: ABO/RH(D): O POS

## 2015-10-24 SURGERY — Surgical Case
Anesthesia: Spinal

## 2015-10-24 MED ORDER — FENTANYL CITRATE (PF) 100 MCG/2ML IJ SOLN
INTRAMUSCULAR | Status: DC | PRN
Start: 2015-10-24 — End: 2015-10-24
  Administered 2015-10-24: 20 ug via INTRATHECAL

## 2015-10-24 MED ORDER — ACETAMINOPHEN 500 MG PO TABS
1000.0000 mg | ORAL_TABLET | Freq: Four times a day (QID) | ORAL | Status: AC
  Administered 2015-10-24 (×3): 1000 mg via ORAL
  Filled 2015-10-24 (×3): qty 2

## 2015-10-24 MED ORDER — SODIUM CHLORIDE 0.9 % IJ SOLN: 3.0000 mL | INTRAMUSCULAR | Status: DC | PRN

## 2015-10-24 MED ORDER — BUPIVACAINE IN DEXTROSE 0.75-8.25 % IT SOLN
INTRATHECAL | Status: DC | PRN
  Administered 2015-10-24: 1.4 mL via INTRATHECAL

## 2015-10-24 MED ORDER — SCOPOLAMINE 1 MG/3DAYS TD PT72
MEDICATED_PATCH | TRANSDERMAL | Status: DC | PRN
  Administered 2015-10-24: 1 via TRANSDERMAL

## 2015-10-24 MED ORDER — DIPHENHYDRAMINE HCL 25 MG PO CAPS: 25.0000 mg | ORAL_CAPSULE | Freq: Four times a day (QID) | ORAL | Status: DC | PRN

## 2015-10-24 MED ORDER — PRENATAL MULTIVITAMIN CH
1.0000 | ORAL_TABLET | Freq: Every day | ORAL | Status: DC
  Administered 2015-10-24 – 2015-10-27 (×4): 1 via ORAL
  Filled 2015-10-24 (×4): qty 1

## 2015-10-24 MED ORDER — SIMETHICONE 80 MG PO CHEW
80.0000 mg | CHEWABLE_TABLET | ORAL | Status: DC
  Administered 2015-10-25 (×2): 80 mg via ORAL
  Filled 2015-10-24 (×2): qty 1

## 2015-10-24 MED ORDER — NALBUPHINE HCL 10 MG/ML IJ SOLN: 5.0000 mg | INTRAMUSCULAR | Status: DC | PRN

## 2015-10-24 MED ORDER — PHENYLEPHRINE 8 MG IN D5W 100 ML (0.08MG/ML) PREMIX OPTIME
INJECTION | INTRAVENOUS | Status: AC
  Filled 2015-10-24: qty 100

## 2015-10-24 MED ORDER — METOCLOPRAMIDE HCL 5 MG/ML IJ SOLN
INTRAMUSCULAR | Status: AC
  Filled 2015-10-24: qty 2

## 2015-10-24 MED ORDER — SCOPOLAMINE 1 MG/3DAYS TD PT72
MEDICATED_PATCH | TRANSDERMAL | Status: AC
  Filled 2015-10-24: qty 1

## 2015-10-24 MED ORDER — PHENYLEPHRINE HCL 10 MG/ML IJ SOLN
INTRAMUSCULAR | Status: DC | PRN
  Administered 2015-10-24: 80 ug via INTRAVENOUS

## 2015-10-24 MED ORDER — HYDROMORPHONE HCL 1 MG/ML IJ SOLN: 0.2500 mg | INTRAMUSCULAR | Status: DC | PRN

## 2015-10-24 MED ORDER — SENNOSIDES-DOCUSATE SODIUM 8.6-50 MG PO TABS
2.0000 | ORAL_TABLET | ORAL | Status: DC
  Administered 2015-10-25 (×2): 2 via ORAL
  Filled 2015-10-24 (×2): qty 2

## 2015-10-24 MED ORDER — IBUPROFEN 600 MG PO TABS
600.0000 mg | ORAL_TABLET | Freq: Four times a day (QID) | ORAL | Status: DC | PRN
  Administered 2015-10-26: 600 mg via ORAL

## 2015-10-24 MED ORDER — KETOROLAC TROMETHAMINE 30 MG/ML IJ SOLN: 30.0000 mg | Freq: Four times a day (QID) | INTRAMUSCULAR | Status: DC | PRN

## 2015-10-24 MED ORDER — PHENYLEPHRINE 40 MCG/ML (10ML) SYRINGE FOR IV PUSH (FOR BLOOD PRESSURE SUPPORT)
PREFILLED_SYRINGE | INTRAVENOUS | Status: AC
  Filled 2015-10-24: qty 10

## 2015-10-24 MED ORDER — SIMETHICONE 80 MG PO CHEW
80.0000 mg | CHEWABLE_TABLET | Freq: Three times a day (TID) | ORAL | Status: DC
  Administered 2015-10-24 – 2015-10-27 (×9): 80 mg via ORAL
  Filled 2015-10-24 (×9): qty 1

## 2015-10-24 MED ORDER — MEPERIDINE HCL 25 MG/ML IJ SOLN: 6.2500 mg | INTRAMUSCULAR | Status: DC | PRN

## 2015-10-24 MED ORDER — KETOROLAC TROMETHAMINE 30 MG/ML IJ SOLN
INTRAMUSCULAR | Status: AC
  Filled 2015-10-24: qty 1

## 2015-10-24 MED ORDER — KETOROLAC TROMETHAMINE 30 MG/ML IJ SOLN: 30.0000 mg | Freq: Once | INTRAMUSCULAR | Status: DC | PRN

## 2015-10-24 MED ORDER — NALBUPHINE HCL 10 MG/ML IJ SOLN: 5.0000 mg | Freq: Once | INTRAMUSCULAR | Status: DC | PRN

## 2015-10-24 MED ORDER — FERROUS SULFATE 325 (65 FE) MG PO TABS
325.0000 mg | ORAL_TABLET | Freq: Two times a day (BID) | ORAL | Status: DC
  Administered 2015-10-24 – 2015-10-27 (×7): 325 mg via ORAL
  Filled 2015-10-24 (×7): qty 1

## 2015-10-24 MED ORDER — SIMETHICONE 80 MG PO CHEW: 80.0000 mg | CHEWABLE_TABLET | ORAL | Status: DC | PRN

## 2015-10-24 MED ORDER — DIPHENHYDRAMINE HCL 25 MG PO CAPS
25.0000 mg | ORAL_CAPSULE | ORAL | Status: DC | PRN
  Filled 2015-10-24: qty 1

## 2015-10-24 MED ORDER — MEPERIDINE HCL 25 MG/ML IJ SOLN
INTRAMUSCULAR | Status: AC
  Filled 2015-10-24: qty 1

## 2015-10-24 MED ORDER — OXYTOCIN 10 UNIT/ML IJ SOLN
INTRAMUSCULAR | Status: AC
  Filled 2015-10-24: qty 4

## 2015-10-24 MED ORDER — DIPHENHYDRAMINE HCL 50 MG/ML IJ SOLN
INTRAMUSCULAR | Status: DC | PRN
  Administered 2015-10-24: 12.5 mg via INTRAVENOUS

## 2015-10-24 MED ORDER — NALOXONE HCL 2 MG/2ML IJ SOSY
1.0000 ug/kg/h | PREFILLED_SYRINGE | INTRAMUSCULAR | Status: DC | PRN
  Filled 2015-10-24: qty 2

## 2015-10-24 MED ORDER — NALOXONE HCL 0.4 MG/ML IJ SOLN: 0.4000 mg | INTRAMUSCULAR | Status: DC | PRN

## 2015-10-24 MED ORDER — DIBUCAINE 1 % RE OINT: 1.0000 "application " | TOPICAL_OINTMENT | RECTAL | Status: DC | PRN

## 2015-10-24 MED ORDER — OXYCODONE-ACETAMINOPHEN 5-325 MG PO TABS: 2.0000 | ORAL_TABLET | ORAL | Status: DC | PRN

## 2015-10-24 MED ORDER — BUPIVACAINE-EPINEPHRINE (PF) 0.25% -1:200000 IJ SOLN
INTRAMUSCULAR | Status: AC
  Filled 2015-10-24: qty 30

## 2015-10-24 MED ORDER — LACTATED RINGERS IV SOLN: INTRAVENOUS | Status: DC

## 2015-10-24 MED ORDER — METOCLOPRAMIDE HCL 5 MG/ML IJ SOLN
INTRAMUSCULAR | Status: DC | PRN
  Administered 2015-10-24: 10 mg via INTRAVENOUS

## 2015-10-24 MED ORDER — MORPHINE SULFATE (PF) 0.5 MG/ML IJ SOLN
INTRAMUSCULAR | Status: AC
  Filled 2015-10-24: qty 100

## 2015-10-24 MED ORDER — OXYTOCIN 40 UNITS IN LACTATED RINGERS INFUSION - SIMPLE MED: 62.5000 mL/h | INTRAVENOUS | Status: AC

## 2015-10-24 MED ORDER — MEPERIDINE HCL 25 MG/ML IJ SOLN
INTRAMUSCULAR | Status: DC | PRN
  Administered 2015-10-24 (×2): 12.5 mg via INTRAVENOUS

## 2015-10-24 MED ORDER — METHYLERGONOVINE MALEATE 0.2 MG/ML IJ SOLN: 0.2000 mg | INTRAMUSCULAR | Status: DC | PRN

## 2015-10-24 MED ORDER — ZOLPIDEM TARTRATE 5 MG PO TABS: 5.0000 mg | ORAL_TABLET | Freq: Every evening | ORAL | Status: DC | PRN

## 2015-10-24 MED ORDER — MORPHINE SULFATE (PF) 0.5 MG/ML IJ SOLN
INTRAMUSCULAR | Status: DC | PRN
  Administered 2015-10-24: .2 mg via INTRATHECAL

## 2015-10-24 MED ORDER — KETOROLAC TROMETHAMINE 30 MG/ML IJ SOLN
30.0000 mg | Freq: Four times a day (QID) | INTRAMUSCULAR | Status: DC | PRN
  Administered 2015-10-24: 30 mg via INTRAMUSCULAR

## 2015-10-24 MED ORDER — OXYCODONE-ACETAMINOPHEN 5-325 MG PO TABS
1.0000 | ORAL_TABLET | ORAL | Status: DC | PRN
  Administered 2015-10-25 – 2015-10-27 (×5): 1 via ORAL
  Filled 2015-10-24 (×6): qty 1

## 2015-10-24 MED ORDER — PROMETHAZINE HCL 25 MG/ML IJ SOLN: 6.2500 mg | INTRAMUSCULAR | Status: DC | PRN

## 2015-10-24 MED ORDER — ONDANSETRON HCL 4 MG/2ML IJ SOLN
INTRAMUSCULAR | Status: AC
Start: 2015-10-24 — End: 2015-10-24
  Filled 2015-10-24: qty 2

## 2015-10-24 MED ORDER — ONDANSETRON HCL 4 MG/2ML IJ SOLN: 4.0000 mg | Freq: Three times a day (TID) | INTRAMUSCULAR | Status: DC | PRN

## 2015-10-24 MED ORDER — BUPIVACAINE HCL (PF) 0.25 % IJ SOLN
INTRAMUSCULAR | Status: AC
  Filled 2015-10-24: qty 10

## 2015-10-24 MED ORDER — LACTATED RINGERS IV SOLN
40.0000 [IU] | INTRAVENOUS | Status: DC | PRN
  Administered 2015-10-24: 40 [IU] via INTRAVENOUS

## 2015-10-24 MED ORDER — LANOLIN HYDROUS EX OINT: 1.0000 "application " | TOPICAL_OINTMENT | CUTANEOUS | Status: DC | PRN

## 2015-10-24 MED ORDER — SODIUM CHLORIDE 0.9 % IV SOLN: 250.0000 mL | INTRAVENOUS | Status: DC

## 2015-10-24 MED ORDER — BISACODYL 10 MG RE SUPP: 10.0000 mg | Freq: Every day | RECTAL | Status: DC | PRN

## 2015-10-24 MED ORDER — ACETAMINOPHEN 325 MG PO TABS
650.0000 mg | ORAL_TABLET | ORAL | Status: DC | PRN
  Administered 2015-10-26: 650 mg via ORAL
  Filled 2015-10-24: qty 2

## 2015-10-24 MED ORDER — ONDANSETRON HCL 4 MG/2ML IJ SOLN
INTRAMUSCULAR | Status: DC | PRN
  Administered 2015-10-24: 4 mg via INTRAVENOUS

## 2015-10-24 MED ORDER — MENTHOL 3 MG MT LOZG: 1.0000 | LOZENGE | OROMUCOSAL | Status: DC | PRN

## 2015-10-24 MED ORDER — DIPHENHYDRAMINE HCL 50 MG/ML IJ SOLN: 12.5000 mg | INTRAMUSCULAR | Status: DC | PRN

## 2015-10-24 MED ORDER — FENTANYL CITRATE (PF) 100 MCG/2ML IJ SOLN
INTRAMUSCULAR | Status: AC
  Filled 2015-10-24: qty 4

## 2015-10-24 MED ORDER — METHYLERGONOVINE MALEATE 0.2 MG PO TABS: 0.2000 mg | ORAL_TABLET | ORAL | Status: DC | PRN

## 2015-10-24 MED ORDER — FLEET ENEMA 7-19 GM/118ML RE ENEM: 1.0000 | ENEMA | Freq: Every day | RECTAL | Status: DC | PRN

## 2015-10-24 MED ORDER — SODIUM CHLORIDE 0.9 % IJ SOLN
3.0000 mL | Freq: Two times a day (BID) | INTRAMUSCULAR | Status: DC
Start: 2015-10-24 — End: 2015-10-27

## 2015-10-24 MED ORDER — BUPIVACAINE HCL (PF) 0.25 % IJ SOLN
INTRAMUSCULAR | Status: DC | PRN
  Administered 2015-10-24: 10 mL

## 2015-10-24 MED ORDER — IBUPROFEN 600 MG PO TABS
600.0000 mg | ORAL_TABLET | Freq: Four times a day (QID) | ORAL | Status: DC
  Administered 2015-10-24 – 2015-10-27 (×11): 600 mg via ORAL
  Filled 2015-10-24 (×12): qty 1

## 2015-10-24 MED ORDER — WITCH HAZEL-GLYCERIN EX PADS: 1.0000 "application " | MEDICATED_PAD | CUTANEOUS | Status: DC | PRN

## 2015-10-24 SURGICAL SUPPLY — 40 items
BARRIER ADHS 3X4 INTERCEED (GAUZE/BANDAGES/DRESSINGS) ×3 IMPLANT
BENZOIN TINCTURE PRP APPL 2/3 (GAUZE/BANDAGES/DRESSINGS) IMPLANT
CLAMP CORD UMBIL (MISCELLANEOUS) IMPLANT
CLOSURE WOUND 1/2 X4 (GAUZE/BANDAGES/DRESSINGS)
CLOTH BEACON ORANGE TIMEOUT ST (SAFETY) ×3 IMPLANT
CONTAINER PREFILL 10% NBF 15ML (MISCELLANEOUS) IMPLANT
DRAPE C SECTION CLR SCREEN (DRAPES) ×3 IMPLANT
DRAPE SHEET LG 3/4 BI-LAMINATE (DRAPES) IMPLANT
DRSG OPSITE POSTOP 4X10 (GAUZE/BANDAGES/DRESSINGS) ×3 IMPLANT
DURAPREP 26ML APPLICATOR (WOUND CARE) ×3 IMPLANT
ELECT REM PT RETURN 9FT ADLT (ELECTROSURGICAL) ×3
ELECTRODE REM PT RTRN 9FT ADLT (ELECTROSURGICAL) ×1 IMPLANT
EXTRACTOR VACUUM M CUP 4 TUBE (SUCTIONS) IMPLANT
EXTRACTOR VACUUM M CUP 4' TUBE (SUCTIONS)
GLOVE BIOGEL PI IND STRL 7.0 (GLOVE) ×2 IMPLANT
GLOVE BIOGEL PI INDICATOR 7.0 (GLOVE) ×4
GLOVE ECLIPSE 6.5 STRL STRAW (GLOVE) ×3 IMPLANT
GOWN STRL REUS W/TWL LRG LVL3 (GOWN DISPOSABLE) ×6 IMPLANT
KIT ABG SYR 3ML LUER SLIP (SYRINGE) IMPLANT
NEEDLE HYPO 22GX1.5 SAFETY (NEEDLE) ×3 IMPLANT
NEEDLE HYPO 25X5/8 SAFETYGLIDE (NEEDLE) IMPLANT
NS IRRIG 1000ML POUR BTL (IV SOLUTION) ×3 IMPLANT
PACK C SECTION WH (CUSTOM PROCEDURE TRAY) ×3 IMPLANT
PAD OB MATERNITY 4.3X12.25 (PERSONAL CARE ITEMS) ×3 IMPLANT
RTRCTR C-SECT PINK 25CM LRG (MISCELLANEOUS) IMPLANT
STRIP CLOSURE SKIN 1/2X4 (GAUZE/BANDAGES/DRESSINGS) IMPLANT
SUT CHROMIC GUT AB #0 18 (SUTURE) IMPLANT
SUT MNCRL 0 VIOLET CTX 36 (SUTURE) ×3 IMPLANT
SUT MON AB 4-0 PS1 27 (SUTURE) IMPLANT
SUT MONOCRYL 0 CTX 36 (SUTURE) ×6
SUT PLAIN 2 0 (SUTURE)
SUT PLAIN 2 0 XLH (SUTURE) IMPLANT
SUT PLAIN ABS 2-0 CT1 27XMFL (SUTURE) IMPLANT
SUT VIC AB 0 CT1 36 (SUTURE) ×6 IMPLANT
SUT VIC AB 2-0 CT1 27 (SUTURE) ×2
SUT VIC AB 2-0 CT1 TAPERPNT 27 (SUTURE) ×1 IMPLANT
SUT VIC AB 4-0 PS2 27 (SUTURE) IMPLANT
SYR CONTROL 10ML LL (SYRINGE) ×3 IMPLANT
TOWEL OR 17X24 6PK STRL BLUE (TOWEL DISPOSABLE) ×3 IMPLANT
TRAY FOLEY CATH SILVER 14FR (SET/KITS/TRAYS/PACK) IMPLANT

## 2015-10-24 NOTE — Anesthesia Postprocedure Evaluation (Signed)
Anesthesia Post Note  Patient: Estate manager/land agentMelanie Angelica  Procedure(s) Performed: Procedure(s) (LRB): CESAREAN SECTION (N/A)  Anesthesia type: Spinal  Patient location: PACU  Post pain: Pain level controlled  Post assessment: Post-op Vital signs reviewed  Last Vitals:  Filed Vitals:   10/24/15 0245  BP: 137/82  Pulse: 74  Temp:   Resp: 19    Post vital signs: Reviewed  Level of consciousness: awake  Complications: No apparent anesthesia complications

## 2015-10-24 NOTE — Lactation Note (Signed)
This note was copied from the chart of [name]. Lactation Consultation Note:  Lactation brochure given to mother. Mother was given LPT infant parent instructions sheet. Father is not present at this time for teaching. Advised mother in LPI behaviors. Discussed the need to post pump and to offer additional calories due to immaturity. Mother states she took hospital breastfeeding classes. She has been able to hand express a few drops on a spoon several times to feed both infants. Mother has attempt to breastfeed both [name] and [name] several times. She states she has felt tugging but no sustained latch.   Assist with feeding  [name], [name]. Infant nuzzled and suckled on and off for 20 mins in cross cradle hold. Assist mother with pumping. Mother pumped 4 ml. 2ml was given to [facility] along with 5 ml of Alementum using a gloved finger and syringe.   [name], was placed on the Rt breast in football hold. Infant nuzzled and suckled on and off for 20 mins. Infant was given 2ml ebm and 5 ml of Alementum with a gloved finger and syringe.  Lot of teaching with mother and family at bedside. Advised mother to post pump after feedings. Suggested that mother page when father arrives for more teaching on assisting mother with feedings. Discussed frequent STS and advised in cluster feeding. Mother very tired but receptive to all teaching.   Patient Name: Teresa RobinsonsBoyA Teresa Reeves Today's Date: 10/24/2015 Reason for consult: Initial assessment   Maternal Data    Feeding Feeding Type: Formula Length of feed: 15 min (on and off)  LATCH Score/Interventions Latch: Repeated attempts needed to sustain latch, nipple held in mouth throughout feeding, stimulation needed to elicit sucking reflex.  Audible Swallowing: A few with stimulation Intervention(s): Skin to skin;Hand expression  Type of Nipple: Everted at rest and after stimulation  Comfort (Breast/Nipple): Soft / non-tender     Hold  (Positioning): Assistance needed to correctly position infant at breast and maintain latch.  LATCH Score: 7  Lactation Tools Discussed/Used     Consult Status Consult Status: Follow-up Date: 10/24/15 Follow-up type: In-patient    Teresa BornKendrick, Teresa Reeves [facility] 10/24/2015, 5:22 PM

## 2015-10-24 NOTE — Transfer of Care (Signed)
Immediate Anesthesia Transfer of Care Note  Patient: Estate manager/land agentMelanie Reeves  Procedure(s) Performed: Procedure(s): CESAREAN SECTION (N/A)  Patient Location: PACU  Anesthesia Type:Spinal  Level of Consciousness: awake, alert  and oriented  Airway & Oxygen Therapy: Patient Spontanous Breathing  Post-op Assessment: Report given to RN and Post -op Vital signs reviewed and stable  Post vital signs: Reviewed and stable  Last Vitals:  Filed Vitals:   10/23/15 2344  BP: 135/63  Pulse: 81  Temp:   Resp: 16    Complications: No apparent anesthesia complications

## 2015-10-24 NOTE — Anesthesia Procedure Notes (Signed)
Spinal Patient location during procedure: OR Start time: 10/24/2015 12:17 AM End time: 10/24/2015 12:21 AM Staffing Anesthesiologist: Leilani AbleHATCHETT, Teresa Reeves Performed by: anesthesiologist  Preanesthetic Checklist Completed: patient identified, surgical consent, pre-op evaluation, timeout performed, IV checked, risks and benefits discussed and monitors and equipment checked Spinal Block Patient position: sitting Prep: site prepped and draped and DuraPrep Patient monitoring: heart rate, cardiac monitor, continuous pulse ox and blood pressure Approach: midline Location: L3-4 Injection technique: single-shot Needle Needle type: Pencan  Needle gauge: 24 G Needle length: 9 cm Needle insertion depth: 4 cm Assessment Sensory level: T4

## 2015-10-24 NOTE — Op Note (Signed)
NAMReino Bellis:  Brymer, Emmaleah              ACCOUNT NO.:  [id number]  MEDICAL RECORD NO.:  [id number]  LOCATION:  9148                          FACILITY:  [facility]  PHYSICIAN:  Maxie BetterSheronette Macallan Ord, M.D.DATE OF BIRTH:  May 08, 1986  DATE OF PROCEDURE:  10/24/2015 DATE OF DISCHARGE:                              OPERATIVE REPORT   PREOPERATIVE DIAGNOSES:  Preterm premature rupture of membranes.  Twin gestation.  Breech vertex presentation.  Early labor.  Intrauterine gestation at 5035 and 6/7 weeks.  POSTOPERATIVE DIAGNOSES:  Breech Duanne Limerick/transverse presentation.  Preterm premature rupture of membranes.  Early labor.  Twin gestation at 6635 and 6/7 weeks.  PROCEDURES:  Primary cesarean section, Sharl MaKerr hysterotomy, paratubal cyst removal.  ANESTHESIA:  Spinal.  SURGEON:  Maxie BetterSheronette Gabby Rackers, M.D.  ASSISTANT:  Marlinda Mikeanya Bailey, CNM.  INDICATIONS:  This is a 29 year old, gravida 1, para 0, married white female with twin di-di gestation at 3535 and 5/7 weeks presented with spontaneous rupture of membranes and early labor.  Her prenatal course had been complicated by the recent cervical dilation for which the patient underwent betamethasone treatment on November 4, and October 17, 2015.  Her group B strep was positive.  The last ultrasound showed concordant growth.  The patient had an ultrasound today that confirmed that the first twin A was a breech presentation and twin B was vertex by ultrasound, and the patient was in early labor.  The patient was consented for a primary cesarean section.  Risks of surgery were reviewed and consent signed.  The patient was transferred to the operating room.  DESCRIPTION OF PROCEDURE:  Under adequate spinal anesthesia, the patient was placed in a supine position with a left lateral tilt.  She was sterilely prepped and draped in usual fashion.  An indwelling Foley catheter was sterilely placed.  A 0.25% Marcaine was injected along the planned Pfannenstiel skin incision site.   Pfannenstiel skin incision was made and carried down to the rectus fascia.  The rectus fascia was opened transversely.  The rectus fascia then bluntly and sharply dissected off the rectus muscle in superior and inferior fashion.  The rectus muscle was split in the midline.  The parietal peritoneum was entered sharply and extended.  A self-retaining Alexis retractor was then placed.  A well developed lower uterine segment was noted.  The vesicouterine peritoneum was opened transversely.  The bladder was then bluntly dissected off the lower uterine segment and displaced inferiorly.  A curvilinear low transverse uterine incision was then made and extended with bandage scissors.  Subsequent delivery of a live female from a frank breech position was accomplished using the usual breech maneuver.  The baby was bulb suctioned from the abdomen.  The cord was clamped, cut.  The baby was transferred to the awaiting pediatrician who assigned Apgars 8 and 9 at 1 and 5 minutes.  The edge of the placenta anteriorly was noted, bulging membrane was noted.  On twin B, artificial rupture of membranes was then performed.  Clear amniotic fluid was noted.  Foot and the hand were presenting.  The second foot was found. The hand was pushed back up, and the baby was delivered from a transverse backup position in a usual  breech maneuver with the cord wrapped around the body which was reduced.  The cord was clamped, cut. The baby was bulb suctioned from the abdomen.  The female baby was transferred to the awaiting pediatricians who assigned Apgars of 8 and 9 at 1 and 5 minutes.  The placenta was manually removed x2 and sent to Pathology. Uterine cavity was cleaned of debris.  Uterine incision had no extension, was closed in 2 layers, the first layer with 0 Monocryl running locked stitch, second layer was imbricated using 0 Monocryl suture.  Small bleeding along the peritoneal edges were cauterized.  The abdomen was  then copiously irrigated and suctioned.  Normal tubes and ovaries were noted bilaterally.  An elongated paratubal cyst on the left was noted which was removed and sent to Pathology.  The paracolic gutters were cleaned of debris.  The Alexis retractor was removed. Interceed was placed in an inverted T fashion overlying the uterine incision.  The parietal peritoneum was closed with 2-0 Vicryl.  The rectus fascia was closed with 0 Vicryl x2.  The subcutaneous area was irrigated, small bleeders cauterized.  Interrupted 2-0 plain sutures placed and the skin approximated with Ethicon staples.  SPECIMEN:  Placenta x2 and paratubal cyst sent to Pathology.  ESTIMATED BLOOD LOSS:  1100 ml.  URINE OUTPUT:  100 ml.  INTRAOPERATIVE FLUID:  2800 mL.  COUNTS:  Sponge and instrument counts x2 was correct.  COMPLICATION:  None.  The patient tolerated the procedure well and was transferred to recovery in stable condition.  Babies x2 were placed skin to skin with mom and/or dad.     Maxie Better, M.D.     Herrings/MEDQ  D:  10/24/2015  T:  10/24/2015  Job:  [id number]

## 2015-10-24 NOTE — Progress Notes (Addendum)
Patient ID: Teresa BellisMelanie Reeves, female   DOB: 07/25/1986, 29 y.o.   MRN: [id number] Subjective: S/P Primary Cesarean Delivery / SROM / Twin Gestation  POD# 0 Information for the patient's newborn:  Teresa HuaZimmer, BoyA Teresa Reeves [[id number]][[id number]]  female Information for the patient's newborn:  Teresa CopasZimmer, BoyB Teresa Reeves [[id number]][[id number]]  female  / circ planning x 2  Reports feeling very tired Feeding: breast and bottle Patient reports tolerating PO.  Breast symptoms: none Pain controlled with ibuprofen (OTC) and epidural medications Denies HA/SOB/C/P/N/V/dizziness. Flatus asent. No BM. She reports vaginal bleeding as normal, without clots.  She is ambulating, Foley indwelling, draining clear urine to gravity.     Objective:   VS:  Filed Vitals:   10/24/15 0312 10/24/15 0415 10/24/15 0515 10/24/15 0615  BP: 140/73 131/56 121/59 117/62  Pulse: 81 75 68 67  Temp:  98.9 F (37.2 C) 99.3 F (37.4 C) 98.5 F (36.9 C)  TempSrc:  Axillary Oral Oral  Resp: 20 20 20 20   Height:      Weight:      SpO2: 97% 96% 95% 97%     Intake/Output Summary (Last 24 hours) at 10/24/15 0831 Last data filed at 10/24/15 0515  Gross per 24 hour  Intake   3240 ml  Output   2175 ml  Net   1065 ml        Recent Labs  10/23/15 2329  WBC 10.3  HGB 12.9  HCT 37.2  PLT 218     Blood type: O POS (11/11 2330)  Rubella: Immune (05/13 0000)     Physical Exam:   General: alert, cooperative, fatigued and no distress  CV: Regular rate and rhythm, S1S2 present or without murmur or extra heart sounds  Resp: clear  Abdomen: soft, nontender, normal bowel sounds  Incision: clean, dry, intact and skin well-approximated with staples  Uterine Fundus: firm, 1 FB below umbilicus, nontender  Lochia: minimal  Ext: extremities normal, atraumatic, no cyanosis or edema, Homans sign is negative, no sign of DVT and no edema, redness or tenderness in the calves or thighs   Assessment/Plan: 29 y.o.   POD# 0.  S/P Cesarean Delivery.  Indications:  SROM and twin gestation                Principal Problem:   Twin birth delivered by cesarean section in hospital (11/11)  Doing well, stable.               Regular diet as tolerated D/C foley per unit protocol Ambulate Routine post-op care  Teresa GowerAWSON, Teresa Reeves, M, MSN, CNM 10/24/2015, 8:31 AM

## 2015-10-24 NOTE — Anesthesia Preprocedure Evaluation (Signed)
Anesthesia Evaluation  Patient identified by MRN, date of birth, ID band Patient awake    Reviewed: Allergy & Precautions, H&P , NPO status , Patient's Chart, lab work & pertinent test results  Airway Mallampati: I  TM Distance: >3 FB Neck ROM: full    Dental no notable dental hx.    Pulmonary neg pulmonary ROS,    Pulmonary exam normal        Cardiovascular negative cardio ROS Normal cardiovascular exam     Neuro/Psych negative neurological ROS  negative psych ROS   GI/Hepatic negative GI ROS, Neg liver ROS,   Endo/Other  negative endocrine ROS  Renal/GU negative Renal ROS     Musculoskeletal   Abdominal Normal abdominal exam  (+)   Peds  Hematology negative hematology ROS (+)   Anesthesia Other Findings   Reproductive/Obstetrics (+) Pregnancy                             Anesthesia Physical Anesthesia Plan  ASA: II  Anesthesia Plan: Spinal   Post-op Pain Management:    Induction:   Airway Management Planned:   Additional Equipment:   Intra-op Plan:   Post-operative Plan:   Informed Consent: I have reviewed the patients History and Physical, chart, labs and discussed the procedure including the risks, benefits and alternatives for the proposed anesthesia with the patient or authorized representative who has indicated his/her understanding and acceptance.     Plan Discussed with: CRNA and Surgeon  Anesthesia Plan Comments:         Anesthesia Quick Evaluation  

## 2015-10-24 NOTE — Consult Note (Signed)
[facility]  Delivery Note:  C-section       10/24/2015  12:53 AM  I was called to the operating room at the request of the patient's obstetrician (Dr. Cherly Hensenousins) for a primary c-section for breech twin delivery.  PRENATAL HX:  29 y/o G1P0 at 6335 and 6/7 weeks with di-di twins.  Twin A was breech, Twin B vertex and mother went into active labor with ROM at 2015 on 11/11 (ROM ~4.5 hours).  She is GBS+ and did not receive adequate antibiotic prophylaxis.  At delivery, both infants were breech.    DELIVERY Infant A:  Infant was vigorous at delivery, requiring no resuscitation other than standard warming, drying and stimulation.  APGARs 8 and 9.  Exam within normal limits without respiratory distress.  After 5 minutes, baby left with nurse to assist parents with skin-to-skin care.   DELIVERY Infant B:  Infant was vigorous at delivery, requiring no resuscitation other than standard warming, drying and stimulation.  APGARs 8 and 9.  Exam within normal limits without respiratory distress.  After 5 minutes, baby left with nurse to assist parents with skin-to-skin care.   _____________________ Electronically Signed By: Maryan CharLindsey Sye Schroepfer, MD Neonatologist

## 2015-10-24 NOTE — Brief Op Note (Signed)
10/23/2015 - 10/24/2015  1:47 AM  PATIENT:  Teresa Reeves  29 y.o. female  PRE-OPERATIVE DIAGNOSIS: preterm premature rupture of membranes, breech presentation, early labor, Twin gestation @ 4335 5/7 weeks  POST-OPERATIVE DIAGNOSIS: PPROM. Breech/transverse presentation, early labor, twin gestation @ 5035 6/7 weeks Left paratubal cyst  PROCEDURE:  Primary cesarean section, kerr hysterotomy, left paratubal cyst removal  SURGEON:  Surgeon(s) and Role:    * Maxie BetterSheronette Murrell Elizondo, MD - Primary  PHYSICIAN ASSISTANT:   ASSISTANTS: Marlinda Mikeanya Bailey, CNM   ANESTHESIA:   spinal Findings: live female x 2( breech/transverse). Apgar 8/9 x 2, left paratubal cyst, nl ovaries, ant placenta EBL:  Total I/O In: 3000 [I.V.:3000] Out: 1200 [Urine:100; Blood:1100]  BLOOD ADMINISTERED:none  DRAINS: none   LOCAL MEDICATIONS USED:  MARCAINE     SPECIMEN:  Source of Specimen:  placenta x2,  left paratubal cyst  DISPOSITION OF SPECIMEN:  PATHOLOGY  COUNTS:  YES  TOURNIQUET:  * No tourniquets in log *  DICTATION: .Other Dictation: Dictation Number [id number]  PLAN OF CARE: Admit to inpatient   PATIENT DISPOSITION:  PACU - hemodynamically stable.   Delay start of Pharmacological VTE agent (>24hrs) due to surgical blood loss or risk of bleeding: no

## 2015-10-24 NOTE — Lactation Note (Signed)
This note was copied from the chart of Teresa Reeves. Lactation Consultation Note: Father paged for assistance with feeding twins. Mother had [name] cradled attempting to latch . [name] screaming in fathers arms. Both parents very tired. Father states they have not had rest or sleep since Thursday.  Mother wanted to try using her Breast friend pillow. Assist with latching both infants on in football position. Father instructed in using a tea-cup hold to latch infant. Both infants latched on and off for 15-20 mins. Both infant were observed with a few swallows and better suckling patterns.  Lots of teaching instructions given to parents on need for extra calories until mother's milk comes to volume.  Baby A [name] was given 8 ml of Alementum with a gloved finger and curved tip syring. He spit most of feeding with large amt of mucus.  Baby B [name], was given 10 ml of Alementum with a slow flow bottle nipple. Parents taught paced bottle feeding.  Discussion on methods to use to supplement infants. Advised parents of the need to nap often and request family support to hold infants while they sleep. Suggested that mother nap since infants had nuzzling time and were fed. Mother choose to post pump at this time.  Advised to call for staff assistance with feedings.    Patient Name: Teresa Reeves Today's Date: 10/24/2015 Reason for consult: Follow-up assessment   Maternal Data    Feeding Feeding Type: Formula Length of feed: 20 min (ona nd off)  LATCH Score/Interventions Latch: Repeated attempts needed to sustain latch, nipple held in mouth throughout feeding, stimulation needed to elicit sucking reflex. Intervention(s): Adjust position;Assist with latch;Breast massage;Breast compression  Audible Swallowing: A few with stimulation Intervention(s): Skin to skin;Hand expression Intervention(s): Skin to skin;Hand expression;Alternate breast massage  Type of Nipple: Everted at rest and  after stimulation  Comfort (Breast/Nipple): Soft / non-tender     Hold (Positioning): Assistance needed to correctly position infant at breast and maintain latch.  LATCH Score: 7  Lactation Tools Discussed/Used Pump Review: Setup, frequency, and cleaning;Milk Storage   Consult Status Consult Status: Follow-up Date: 10/24/15 Follow-up type: In-patient    Khushbu Pippen McCoy 10/24/2015, 5:32 PM    

## 2015-10-25 ENCOUNTER — Encounter (HOSPITAL_COMMUNITY): Payer: Self-pay | Admitting: Obstetrics and Gynecology

## 2015-10-25 LAB — CBC
HCT: 34 % — ABNORMAL LOW (ref 36.0–46.0)
Hemoglobin: 11.5 g/dL — ABNORMAL LOW (ref 12.0–15.0)
MCH: 33.8 pg (ref 26.0–34.0)
MCHC: 33.8 g/dL (ref 30.0–36.0)
MCV: 100 fL (ref 78.0–100.0)
PLATELETS: 193 10*3/uL (ref 150–400)
RBC: 3.4 MIL/uL — ABNORMAL LOW (ref 3.87–5.11)
RDW: 12.5 % (ref 11.5–15.5)
WBC: 13.9 10*3/uL — AB (ref 4.0–10.5)

## 2015-10-25 NOTE — Progress Notes (Signed)
Patient ID: Teresa Reeves, female   DOB: 02/29/1986, 29 y.o.   MRN: [id number] Subjective: S/P Primary Cesarean Delivery / SROM / Twin Gestation  POD# 1 Information for the patient's newborn:  Sela HuaZimmer, BoyA Teresa Reeves [[id number]][[id number]]  female Information for the patient's newborn:  Wyline CopasZimmer, BoyB Teresa Reeves [[id number]][[id number]]  female   / circ done x 2  Reports feeling very tired Feeding: breast and bottle Patient reports tolerating PO.  Breast symptoms: none Pain controlled with ibuprofen (OTC) and epidural medications Denies HA/SOB/C/P/N/V/dizziness. Flatus present. No BM. She reports vaginal bleeding as normal, without clots.  She is ambulating, urinating with no difficulty.     Objective:   VS:  Filed Vitals:   10/24/15 1428 10/24/15 1809 10/24/15 2350 10/25/15 0600  BP: 122/74 143/67 129/60 139/72  Pulse: 88 68 65 57  Temp: 98 F (36.7 C)  97.4 F (36.3 C) 97.6 F (36.4 C)  TempSrc:   Oral   Resp:  18 20 16   Height:      Weight:      SpO2:   98%       Intake/Output Summary (Last 24 hours) at 10/25/15 1022 Last data filed at 10/25/15 0600  Gross per 24 hour  Intake      0 ml  Output   5700 ml  Net  -5700 ml         Recent Labs  10/23/15 2329 10/25/15 0548  WBC 10.3 13.9*  HGB 12.9 11.5*  HCT 37.2 34.0*  PLT 218 193     Blood type: O POS (11/11 2330)  Rubella: Immune (05/13 0000)     Physical Exam:   General: alert, cooperative, fatigued and no distress  CV: Regular rate and rhythm, S1S2 present or without murmur or extra heart sounds  Resp: clear  Abdomen: soft, nontender, normal bowel sounds  Incision: clean, dry, intact and skin well-approximated with staples  Uterine Fundus: firm, 2 FB below umbilicus, nontender  Lochia: minimal  Ext: extremities normal, atraumatic, no cyanosis or edema, Homans sign is negative, no sign of DVT and no edema, redness or tenderness in the calves or thighs   Assessment/Plan: 29 y.o.   POD# 1.  S/P Cesarean Delivery.  Indications: SROM  and twin gestation                Principal Problem:   Twin birth delivered by cesarean section in hospital (11/11)  Doing well, stable.               Regular diet as tolerated Ambulate Routine post-op care  Kenard GowerAWSON, Colbe Viviano, M, MSN, CNM 10/25/2015, 10:22 AM

## 2015-10-25 NOTE — Anesthesia Postprocedure Evaluation (Signed)
Anesthesia Post Note  Patient: Estate manager/land agentMelanie Yohannes  Procedure(s) Performed: Procedure(s) (LRB): CESAREAN SECTION (N/A)  Anesthesia type: Spinal  Patient location: Mother/Baby  Post pain: Pain level controlled  Post assessment: Post-op Vital signs reviewed  Last Vitals:  Filed Vitals:   10/25/15 0600  BP: 139/72  Pulse: 57  Temp: 36.4 C  Resp: 16    Post vital signs: Reviewed  Level of consciousness:alert  Complications: No apparent anesthesia complications

## 2015-10-25 NOTE — Addendum Note (Signed)
Addendum  created 10/25/15 0919 by Lincoln BrighamAngela Draughon Calie Buttrey, CRNA   Modules edited: Notes Section   Notes Section:  File: [id number]

## 2015-10-25 NOTE — Lactation Note (Signed)
This note was copied from the chart of [facility]. Lactation Consultation Note: LC arrived in room and mother states that she just finished breastfeeding Baby A, Mother is now supplementing  infant with 6 ml of formula using  a bottle. Infant had a total of 13 ml.  Mother states she is not seeing any colostrum. Reviewed hand expression and observed multiple drops of colostrum.   Baby B placed on the left  breast. Father had just given 13 ml of formula with a bottle. Infant latched with only a few sucks. Infant remained STS for 15 mins on and off attempts to latch.  Infant were circumcised this am. Mother states that infants have been more awake than expected today. She states that it has been easier to just bottle feed today.   Mother pumped early this am and has not been able to pump. She request another pump kit stating that they didn't have time to wash parts. Advised to call for assistance as needed.   Reviewed LPI instruction sheet again today . Advised parents to try and give more volume with each feeding. Discussed using a nipple shield with feeding. Mother declined. Advised to allow infants to breastfeed before bottles . Discussed different options for a breastfeeding plan . Parents are overwhelmed. Assist mother with pumping . Advised to try and pump after feeding for 15-20 mins. Parents are very tired. Suggested that parents allow friends to come and assist with some of infants care to get some rest. Advised [facility] to [facility] assistance to observe feedings.   Patient Name: Teresa Reeves OFVWA'Q Date: 10/25/2015 Reason for consult: Follow-up assessment   Maternal Data    Feeding Feeding Type: Formula Length of feed: 20 min (per mother)  LATCH Score/Interventions                      Lactation Tools Discussed/Used     Consult Status Consult Status: Follow-up Date: 10/25/15 Follow-up type: In-patient    Jess Barters [facility] 10/25/2015,  5:09 PM

## 2015-10-26 NOTE — Progress Notes (Signed)
Twins to nursery at this time per patient request.  Pt does not wish to be woke up for Motrin.  Pt stated she will call if she wakes and feels she needs Motrin for pain relief.

## 2015-10-26 NOTE — Lactation Note (Signed)
This note was copied from the chart of Teresa Reeves. Lactation Consultation Note  Patient Name: Teresa Reeves [name] ZOXWR'UToday's Date: 10/26/2015 Reason for consult: Follow-up assessment;Infant < 6lbs;Late preterm infant;Multiple gestation   Follow-up consult at 3659 hrs old for LPTI GA 1735.6 Twins both with 6% weight loss.  Parents are following LPTI Plan and using DEBP/HE for extra EBM supplementation in addition to formula supplementation.  Mom has appropriately developed breast tissue with erect nipples.  Mom can easily hand express multiple drops of colostrum.    Baby Boy A "Teresa Reeves" was sleeping at mom's left side football hold, not showing cues to eat.  Mom was attempting to latch Baby Boy B "[facility]eves" [facility] right breast in football hold.   B-Teresa Reeves would take 1-2 sucks then lose latch or spit nipple out.  Infant would suck on gloved finger with several more sucks and then bite.  LC discussed trying a nipple shield.  At first mom was hesitant to use a nipple shield because she "did not want to have to always use a nipple shield" with feedings.  LC educated mom on use of nipple shield with late preterm infants since LPTI have difficulty maintaining latch d/t early age and nipple shield can help stimulate sucking reflex when infant is latched.  Mom consented to trying nipple shield, but became teary-eyed.  When [facility] "what are you feeling?"  Mom stated, "there are two of them."  Mom stated she feels overwhelmed.  LC left room to get nipple shield. Mom has been using DEBP/HE and had two 5 ml containers of EBM.   [facility] how to apply #16 nipple shield with return demonstration and taught her how to pre-load nipple shield with small amount of colostrum to entice infant to suck. #16 nipple shield appropriate fit for mom's nipple, stayed on breast well without falling off, and fit infant's mouth.   LC reviewed with FOB teacup hold and shown how to use hold with nipple shield to  keep shield on breast without losing prefilled colostrum in shield.   [facility] taught [facility] how to use curved-tip syringe at breast for feeding EBM back to infant and to entice infant to maintain sucking rhythm.  FOB was able to return demonstrated holds and EBM supplementation at breast.   Baby B - Teresa Reeves latched after several attempts and took a 2-3 sucks then quit sucking.  After waiting several minutes, Teresa Reeves took several more sucks and then went to sleep.  Teresa Reeves consumed a total of 1 ml at breast with curved-tip syring and then remaining 4 ml via finger feeding with curved-tip syringe as LC left.  LS-7.  Mom inquired about getting a second nipple shield for Baby A Teresa Rosenthal- [name].  LC told mom that if she decided she liked using the shield and had success with shield use, then RN could get her a second shield. LC encouraged mom getting a second shield if she desired.   LC reviewed LPTI plan to limit direct latching to 15-20 minutes and then supplement after latching with available EBM first then formula after for a total of 20-30 ml as recommended on LPTI Plan. Encouraged mom to use DEBP and HE when FOB is supplementing infants.  Two colostrum collection containers given for storage of EBM.   LC reviewed paced-feeding with parents. Lots questions asked throughout consult.  Parents receptive to teaching.  FOB very involved with assisting with feedings, preparing bottles, and keeping supplies washed.  FOB reviewed feeding plan with teach-back.  After finishing finger feeding parents planned to give formula supplementation.   LC encouraged parents to call for assistance as needed with feedings.     Maternal Data Has patient been taught Hand Expression?: Yes  Feeding Feeding Type: Breast Fed Length of feed: 5 min  LATCH Score/Interventions Latch: Repeated attempts needed to sustain latch, nipple held in mouth throughout feeding, stimulation needed to elicit sucking reflex. Intervention(s): Assist with  latch  Audible Swallowing: A few with stimulation Intervention(s): Hand expression  Type of Nipple: Everted at rest and after stimulation  Comfort (Breast/Nipple): Soft / non-tender     Hold (Positioning): Assistance needed to correctly position infant at breast and maintain latch. Intervention(s): Breastfeeding basics reviewed;Support Pillows;Skin to skin  LATCH Score: 7  Lactation Tools Discussed/Used Tools: Nipple Dorris Carnes;Other (comment) (curved-tip syringe at breast for 1 ml then finger fed remaining 4 ml) Nipple shield size: 16 Pump Review: Milk Storage   Consult Status Consult Status: Follow-up Date: 10/27/15 Follow-up type: In-patient    Lendon Ka 10/26/2015, 1:56 PM

## 2015-10-26 NOTE — Progress Notes (Addendum)
POD # 2  Subjective: Pt reports feeling ok/ Pain controlled with Motrin and Percocet Tolerating po/Voiding without problems/ No n/v/ Flatus present Back pain, right lower, feels like "pinched nerve"-present during pregnancy Activity: ad lib Bleeding is light Newborn info:  Information for the patient's newborn:  Sela HuaZimmer, BoyA Stanislawa [[id number]][[id number]]  female Information for the patient's newborn:  Wyline CopasZimmer, BoyB Alysandra [[id number]][[id number]]  female   Circumcision: done/ Feeding: breast and bottle  Objective: VS:  Filed Vitals:   10/24/15 2350 10/25/15 0600 10/25/15 1816 10/26/15 0500  BP: 129/60 139/72 131/71 128/78  Pulse: 65 57 80 70  Temp: 97.4 F (36.3 C) 97.6 F (36.4 C) 98.4 F (36.9 C) 97.6 F (36.4 C)  TempSrc: Oral  Oral   Resp: 20 16 18 18   Height:      Weight:      SpO2: 98%       I&O: Intake/Output      11/13 0701 - 11/14 0700 11/14 0701 - 11/15 0700   Urine (mL/kg/hr)     Total Output       Net              LABS:  Recent Labs  10/23/15 2329 10/25/15 0548  WBC 10.3 13.9*  HGB 12.9 11.5*  HCT 37.2 34.0*  PLT 218 193    Blood type: --/--/O POS, O POS (11/11 2330) Rubella: Immune (05/13 0000)    Physical Exam:  General: alert, cooperative and no distress CV: Regular rate and rhythm Resp: CTA bilaterally Abdomen: soft, nontender, normal bowel sounds Uterine Fundus: firm, below umbilicus, nontender Incision: Covered with Tegaderm and honeycomb dressing; no significant drainage, edema, bruising, or erythema; well approximated with staples Lochia: minimal Ext: edema trace BLE and Homans sign is negative, no sign of DVT   Assessment/: POD # 2/ G1P0102/ S/P C/Section d/t SROM, labor, breech presentation, twins Doing well  Plan: Heating pad to back Continue routine post op orders Anticipate discharge home tomorrow   Signed: Donette LarryBHAMBRI, Shatina, Dorris CarnesN, MSN, CNM 10/26/2015, 11:05 AM

## 2015-10-27 MED ORDER — IBUPROFEN 600 MG PO TABS: 600.0000 mg | ORAL_TABLET | Freq: Four times a day (QID) | ORAL | Status: AC | PRN

## 2015-10-27 MED ORDER — OXYCODONE-ACETAMINOPHEN 5-325 MG PO TABS: 1.0000 | ORAL_TABLET | ORAL | Status: AC | PRN

## 2015-10-27 NOTE — Progress Notes (Signed)
Per pt request @ 2300 this nurse did not disturb patient through night.  Pt was exhausted and needed uninterrupted sleep.  Pt was instructed @ 2300 to call when she needed to see this nurse.

## 2015-10-27 NOTE — Discharge Summary (Signed)
POSTOPERATIVE DISCHARGE SUMMARY:  Patient ID: Teresa Reeves MRN: [id number] DOB/AGE: 1986-09-04 29 y.o.  Admit date: 10/23/2015 Admission Diagnoses: 35.6 weeks / twin gestation / preterm rupture of membranes / breech presentation  Discharge date:  10/27/2015 Discharge Diagnoses: POD 3 s/p primary CS for twin / breech in labor  Prenatal history: G1P0102   EDC : 11/22/2015, by Ultrasound  Prenatal care at [facility]  Primary provider : Cousins Prenatal course complicated by IVF / twin gestation  Prenatal Labs: ABO, Rh: --/--/O POS, O POS (11/11 2330)  Antibody: NEG (11/11 2330) Rubella: Immune (05/13 0000)   RPR: Non Reactive (11/11 2329)  HBsAg: Negative (05/13 0000)  HIV: Non-reactive (05/13 0000)  GBS: Positive (11/04 0000)   Medical / Surgical History :  Past medical history:  Past Medical History  Diagnosis Date  . History of chicken pox   . Allergic state 12/11/2014  . Preventative health care 12/11/2014  . Dichorionic diamniotic twin pregnancy in third trimester 10/16/2015  . Twin birth delivered by cesarean section in hospital (11/11) 10/24/2015  . Twin birth delivered by cesarean section in hospital (11/12) 10/24/2015    Past surgical history:  Past Surgical History  Procedure Laterality Date  . 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 Dental surgery    . Cesarean section N/A 10/24/2015    Procedure: CESAREAN SECTION;  Surgeon: Maxie Better, MD;  Location: [facility];  Service: Obstetrics;  Laterality: N/A;    Family History:  Family History  Problem Relation Age of Onset  . Hypertension Mother   . Hypertension Father   . Gout Father   . Leukemia Maternal Grandmother   . Cancer Maternal Grandmother     Leukemia  . Heart disease Maternal Grandmother     defibrillator,pacer  . Dementia Maternal Grandfather     Social History:  reports that  she has never smoked. She does not have any smokeless tobacco history on file. She reports that she drinks alcohol. She reports that she does not use illicit drugs.  Allergies: Review of patient's allergies indicates no known allergies.   Current Medications at time of admission:  Prior to Admission medications   Medication Sig Start Date End Date Taking? Authorizing Provider  folic acid (FOLVITE) 400 MCG tablet Take 800 mcg by mouth daily.   Yes Historical Provider, MD  Magnesium 250 MG TABS Take 1 tablet by mouth daily.   Yes Historical Provider, MD  Prenatal Vit-Fe Fumarate-FA (PRENATAL MULTIVITAMIN) TABS tablet Take 1 tablet by mouth daily at 12 noon.   Yes Historical Provider, MD                 Procedures: Cesarean section delivery on 10/24/2015 with delivery of twin female newborns by Dr Cherly Hensen   See operative report for further details APGAR (1 MIN):    Teresa Reeves, Teresa Reeves [[id number]]  [address] Teresa Reeves [[id number]]  8   APGAR (5 MINS):    Teresa Reeves, Teresa Reeves [[id number]]  [address] Teresa Reeves [[id number]]  9    Postoperative / postpartum course:  Uncomplicated with discharge on POD 3 Discharge Instructions:  Discharged Condition: stable  Activity: pelvic rest and postoperative restrictions x 2   Diet: routine  Medications:    Medication List    TAKE these medications        folic acid 400 MCG  tablet  Commonly known as:  FOLVITE  Take 800 mcg by mouth daily.     ibuprofen 600 MG tablet  Commonly known as:  ADVIL,MOTRIN  Take 1 tablet (600 mg total) by mouth every 6 (six) hours as needed for mild pain.     Magnesium 250 MG Tabs  Take 1 tablet by mouth daily.     oxyCODONE-acetaminophen 5-325 MG tablet  Commonly known as:  PERCOCET/ROXICET  Take 1 tablet by mouth every 4 (four) hours as needed (for pain scale 4-7).     prenatal multivitamin Tabs tablet  Take 1 tablet by mouth daily at 12 noon.        Wound Care: keep clean and  dry Postpartum Instructions: [facility] discharge booklet - instructions reviewed  Discharge to: Home  Follow up :  [facility] in 2 days for interval visit with nurse for dressing and staple removal [facility] in 6 weeks for routine postpartum visit with Dr Cherly Hensenousins                Signed: Marlinda MikeBAILEY, Meagon Duskin CNM, MSN, [facility] PlcFACNM 10/27/2015, 9:39 AM

## 2015-10-27 NOTE — Progress Notes (Signed)
POSTOPERATIVE DAY # 3 S/P CS - TWINS   S:         Reports feeling "fine"             Tolerating po intake / no nausea / no vomiting / + flatus / no BM             Bleeding is light             Pain controlled with Motrin and percocet             Up ad lib / ambulatory/ voiding QS  Newborns breast feeding  / Circumcisions done   O:  VS: BP 128/88 mmHg  Pulse 84  Temp(Src) 98.3 F (36.8 C) (Oral)  Resp 18  Ht 5\' 9"  (1.753 m)  Wt 84.46 kg (186 lb 3.2 oz)  BMI 27.48 kg/m2  SpO2 99%  LMP 02/16/2015  Breastfeeding? Unknown   LABS:               Recent Labs  10/25/15 0548  WBC 13.9*  HGB 11.5*  PLT 193               Bloodtype: --/--/O POS, O POS (11/11 2330)  Rubella: Immune (05/13 0000)                                 Physical Exam:             Alert and Oriented X3  Abdomen: soft, non-tender, non-distended             Fundus: firm, non-tender, U-1             Dressing intact honeycomb              Incision:  approximated with staples / no erythema / no ecchymosis / no drainage  Extremities: trace edema, no calf pain or tenderness, negative Homans  A:        POD # 3 S/P CS              P:        Routine postoperative care              DC home / [facility] booklet - instructions reviewed     Marlinda MikeBAILEY, Raye Slyter CNM, MSN, [facility] 10/27/2015, 9:36 AM

## 2015-10-27 NOTE — Lactation Note (Signed)
This note was copied from the chart of Hormel FoodsBoyB Cherlynn Winnick. Lactation Consultation Note  Patient Name: Teresa Reeves ZOXWR'UToday's Date: 10/27/2015 Reason for consult: Follow-up assessment;Infant < 6lbs;Late preterm infant;Multiple gestation   Follow up with parents prior to D/C. Mom reports she is using # 16 NS with each feeding and that is going well. She  Reports she is pumping and this morning received 40 ml EBM, mom was pleased with this. Mom reports she feels infant are not always wanting supplement. Discussed LPT infant policy with mom and need to continue supplementation of EBM or Formula after each breast feeding or at least every 3 hours until infant show consistent weight gains. Encouraged parent to have infants finish feeding at breast and supplement within 30 minutes and to limit stimulation between feedings. Discussed supplementation amounts per LPT infant guidelines. Encouraged mom to continue pumping q 3 hours for 15 minutes post BF while family offers supplementation. Babies have follow up with Ped on Thursday and will return for follow up to OP LC on Tuesday 11/22 @ 1 pm.   Twin A "[name]" with 5 BF for 8-25 minutes, 8 bottle feeds of 10-36cc formula and one with 7 cc EBM, 5 voids and 3 stools in last 24 hours. [name] with LATCH score of 8. [name] with 6% weight loss since birth, with increase of 0.2 oz in last 24 hours.   Twin B "[name] " with 4 Bf for 10-25 minutes, 1 attempt, 6 bottle feeds of Formula 8-25 cc and 2 of EBM 5-7 cc, 4 voids and 0 stools in last 24 hours. Last stool recorded was 11/13 @ 2350. Mom reports he had a stool yesterday afternoon. Infant with 5% weight loss in since birth, increased 0.5 oz in last 24 hours. Latch scores of 7-8 recorded.   Reviewed BF information in Taking Care of Baby and Me Booklet. Reviewed LC Services and phone #. Gave appointment reminder for OP appointments. Sent home extra set NS, encouraged mom to attempt without NS over the coming  days to see how well infants do without it.   Maternal Data Formula Feeding for Exclusion: No Does the patient have breastfeeding experience prior to this delivery?: No  Feeding Feeding Type: Breast Fed Length of feed: 10 min  LATCH Score/Interventions                      Lactation Tools Discussed/Used WIC Program: No Pump Review: Setup, frequency, and cleaning;Milk Storage   Consult Status Consult Status: Follow-up Date: 11/03/15 (1 pm) Follow-up type: Out-patient    Silas FloodSharon S Myiah Petkus 10/27/2015, 12:35 PM

## 2015-11-03 ENCOUNTER — Ambulatory Visit (HOSPITAL_COMMUNITY)
Admit: 2015-11-03 | Discharge: 2015-11-03 | Disposition: A | Discharge status: Home / Self Care | Payer: 59 | Attending: Obstetrics and Gynecology | Admitting: Obstetrics and Gynecology

## 2015-11-03 NOTE — Lactation Note (Signed)
Lactation Consult  Mother's reason for visit:  Post D/C follow up feeding assessment of LPT twins Visit Type:  Outpatient Appointment Notes: Mom reports babies are nursing well. Baby B Teresa Reeves is latching with 16 nipple shield and Mom would like him to latch without having to use nipple shield. Baby A Teresa Reeves will latch without the nipple shield unless he is fussy when trying to BF[facility] baby back to birth weight - now 3310 days old. Mom reports she is tandem feeding when she can.  Consult:  Initial Lactation Consultant:  Alfred LevinsGranger, Damichael Hofman Ann  ________________________________________________________________________   Baby's Name: Teresa Reeves Date of Birth: 10/24/2017 Pediatrician: Dr. Chestine Sporelark Gender: female Gestational Age: 6960w6d (At Birth) Birth Weight: 5 lb 9.1 oz (2525 g) Weight at Discharge: Weight: 5 lb 3.8 oz (2375 g)Date of Dis[facility]: [facility]   10/25/15 0020 10/25/15 2335 10/26/15 2320  Weight: 5 lb 5.2 oz (2415 g) 5 lb 3.6 oz (2370 g) 5 lb 3.8 oz (2375 g)   [facility] -  5 lb. 7.5 oz Location:Pediatrician's office Weight today: 5 lb. 7.6 oz/2482 gm    Baby's Name: Teresa Reeves Date of Birth: 10/24/2017 Pediatrician: Dr. Chestine Sporelark Gender: female Gestational Age: 2460w6d (At Birth) Birth Weight: 5 lb 2.5 oz (2340 g) Weight at Discharge:  Weight: (!) 4 lb 14.1 oz (2215 g) [facility]   10/25/15 0050 10/25/15 2350 10/26/15 2358  Weight: 4 lb 14.8 oz (2235 g) 4 lb 13.8 oz (2205 g) 4 lb 14.1 oz (2215 g)   [facility]: 11/03/15 - 5 lb. 3.5 oz Location:Pediatrician's office Weight today: 5 lb. 2. 5 oz/2338 gm     ________________________________________________________________________  Mother's Name: Teresa Reeves Type of delivery:   C/S Breastfeeding Experience:  1st time BF Maternal Medical Conditions:  Infertility Maternal Medications:  Folic Acid, Magnesium, PNV, Motrin prn  ________________________________________________________________________  Breastfeeding History (Post Discharge)  Frequency of breastfeeding:  Mom is tandem feeding with some feedings. Babies are BF every 2-3 hours Duration of feeding:  15-20 minutes on average, some feedings they fall asleep after 5-10 minutes  Supplementation  Breastmilk:  30 ml of EBM 5 times per day on average each baby  Method:  Bottle,   Pumping  Type of pump:  Medela pump in style Frequency:  Every 2-3 hours Volume:  60 ml  total  Infant Intake and Output Assessment  Voids:  Teresa Reeves 7/Teresa Reeves 8 in 24 hrs.  Color:  Clear yellow Stools:  Teresa Reeves 4/Teresa Reeves 5 in 24 hrs.  Color:  Yellow  ________________________________________________________________________  Maternal Breast Assessment  Breast:  Full Nipple:  Erect Pain level:  0   Feeding Assessment/Evaluation  Initial feeding assessment:  Infant's oral assessment:  Variance - Both babies have labial frenulum, will at times have upper lip tucked at the breast needing adjustment. Both babies have lingual frenulum with some dimpling at the end of tongue with crying but this does not seem to affect milk transfer at the breast at this point.   Baby A [name] latched to the left breast in football hold after few attempts and using breast compression. He demonstrated a good nutritive suckling pattern with noted swallows. Upper lip needed to be untucked.   Pre-feed weight:  2482 g  (5 lb. 7.6 oz.) Post-feed weight:  2512 g (5 lb. 8.6 oz.) Amount transferred:  30 ml  With nursing for 8-9 minutes Amount supplemented:  30 ml  Of EBM using [employer] bottle  Baby B Teresa Reeves initially latched to the right breast in football hold using the 16 nipple shield, it took few minutes for him to develop his suckling pattern but once  he did nutritive suckling was observed and breast milk was in the nipple shield. After about 3-4 minutes, took nipple shield off, using breast compression it took few attempts but then Baby Teresa Reeves was able to latch without the nipple shield and demonstrated a good nutritive suckling pattern with noted swallows.   Pre-feed weight:  2338 g  (5 lb. 2.5 oz.) Post-feed weight:  2368 g (5 lb. 3.5 oz.) Amount transferred:  30 ml with nursing 11 minutes Amount supplemented:  0 ml  Attempted to supplement but Baby Teresa Reeves was asleep and would not take bottle  Both babies transferred milk well in the short time they were at the breast. Babies had [facility] breast [facility] few minutes per Mom because they had become fussy. Praised parents for doing good job with feedings by evidence of babies almost back to birth weight in 10 days. Encouraged to continue their current plan of BF every 2-3 hours. Try to keep babies nursing for 15-20 minutes to increase volume at the breast. If babies have a short feeding as at this visit, then supplement with 30 ml of EBM as they have been doing. Would continue to supplement the 5 times per day as this is helping with their weight and growth.  Try to latch without the nipple shield but if baby won't latch then do as we did today, start with nipple shield and after 3-5 minutes take nipple shield off and see if baby will re-latch. Advised Mom she may need to feed one baby at a time so she can support her breasts with nursing to help babies sustain good depth with latch. Continue her post pumping for now to protect her milk supply and to have EBM to supplement.  Consider bottle with valve system to slow flow such as Dr. Manson Passey #1 F/U OP Lactation appointment Tuesday, 11/17/15 at 1:00.

## 2015-11-17 ENCOUNTER — Ambulatory Visit (HOSPITAL_COMMUNITY)
Admission: RE | Admit: 2015-11-17 | Discharge: 2015-11-17 | Disposition: A | Discharge status: Home / Self Care | Payer: 59 | Source: Ambulatory Visit | Attending: Obstetrics and Gynecology | Admitting: Obstetrics and Gynecology

## 2015-11-17 NOTE — Lactation Note (Signed)
Lactation Consult  Mother's reason for visit:  LPTI Twins Visit Type:  Outpatient Lactation Consultation Appointment Notes: Mom is a P1 with LPTI twin GA 35.6; Adjusted Age Today 38.2  Both babies are exclusively breastmilk fed by both direct breastfeedings and supplementation via bottle of EBM.  Parents have been giving EBM supplementation after each feeding for both babies (Teresa Reeves 10-30 ml after each feeding) (Teresa Reeves 30-35 ml after each feeding). Mom reports she is tandem feeding football hold with mostly all feedings.  Mom tandem fed for Teresa Reeves consult today.  Mom reports using a nipple shield #16 with Teresa Reeves (smaller "B" baby) and mostly does not use NS with Teresa Reeves (larger "A" baby).  Teresa Reeves (smaller "B") fed for total of 34 minutes and transferred 54 ml with nipple shield; when he came off he began to get fussy; Teresa Reeves then consumed a 20 ml bottle of EBM.  Teresa Reeves (larger "A") fed for a total of 30 minutes and transferred 74 ml total; he was content after breastfeeding; no supplementation given.  Mom states the only time pain is associated with latches is when the latch is shallow.  Infants are feeding 8-9 times per day.   Consult:  Follow-Up Lactation Consultant:  Teresa Reeves  ________________________________________________________________________ Teresa Reeves Name: Teresa Reeves - Baby "A" Date of Birth: 10/24/2017 Pediatrician: Eliberto Ivory Gender: female Gestational Age: [redacted]w[redacted]d (At Birth) Birth Weight: 5 lb 9.1 oz (2525 g) Weight at Discharge: Weight: 5 lb 3.8 oz (2375 g)Date of Discharge: 10/27/2015 [facility] Weights   10/25/15 0020 10/25/15 2335 10/26/15 2320  Weight: 5 lb 5.2 oz (2415 g) 5 lb 3.6 oz (2370 g) 5 lb 3.8 oz (2375 g)   Last weight - [facility] - 11/28 - 5 lbs, 14.5 oz Weight today (8 days after last wt check): 6 lbs, 7.1 oz (2924 g) (appropriate weight gain >1 oz/ day)   Baby's Name: Teresa Reeves - Baby "B" Date of Birth:  10/24/2017 Pediatrician: Eliberto Ivory Gender: female Gestational Age: 101w6d (At Birth) Birth Weight: 5 lb 2.5 oz (2340 g) Weight at Discharge:  Weight: (!) 4 lb 14.1 oz (2215 g) Date of Discharge: 10/27/2015 [facility] Weights   10/25/15 0050 10/25/15 2350 10/26/15 2358  Weight: 4 lb 14.8 oz (2235 g) 4 lb 13.8 oz (2205 g) 4 lb 14.1 oz (2215 g)   Last weight - [facility] - 11/28 - 5 lbs, 8.5 oz Weight today (8 days after last wt check): 5 lbs, 15.8 oz (2716 g) (appropriate weight gain of ~1 oz/ day)      ________________________________________________________________________  Mother's Name: Teresa Reeves Type of delivery:  C-Section Breastfeeding Experience:  P1 Maternal Medical Conditions:  Infertility ; Intrauterine Insemination (IUI) Maternal Medications: PNV qd; Folic Acid qd; Magnesium qd  ________________________________________________________________________  Breastfeeding History (Post Discharge)  Frequency of breastfeeding:  3-3.5 hrs during day Duration of feeding: Teresa Reeves "A" - 25-30 min            Teresa Reeves "B"   - 18-30 min  Supplementation Teresa Reeves - (larger "A" Baby) Breastmilk:  Volume 10-70ml  Frequency:  After each feeding ~8 times per day   Teresa Reeves - (smaller "B" Baby) Breastmilk:  Volume 30-35 ml Frequency:  After each feeding ~8 times per day  Method:  Bottle,   Pumping  Type of pump:  Medela Metro DEBP Frequency:  4x/day Volume:  80 ml  Infant Intake and Output Assessment Teresa Reeves ("A") Voids:  9 in 24 hrs.  Color:  Clear yellow Stools:  7 in 24 hrs.  Color:  Yellow/seedy  Teresa Reeves ("B") Voids:  8 in 24 hrs.  Color:  Clear yellow Stools:  6 in 24 hrs.  Color:  Yellow/seedy  ________________________________________________________________________  Maternal Breast Assessment  Breast:  Full and Compressible Nipple:  Erect Pain level:  0  _______________________________________________________________________ Feeding  Assessment/Evaluation  Infants' oral assessment:  Variance  - Previous LC noted variance of which I agree with previous assessment from Teresa Mouse, Teresa Reeves, Teresa Reeves "Both babies have labial frenulum, will at times have upper lip tucked at the breast needing adjustment. Both babies have lingual frenulum with some dimpling at the end of tongue with crying but this does not seem to affect milk transfer at the breast at this point."  At current feeding, both infants transferred milk from the breast.   At current assessment also noted that both babies can lateralize tongue and can elevate maintaining contact with finger while sucking on gloved finger.  However, Teresa Reeves does not have as good of intraoral suction pressure as Teresa Reeves.  LC recommended continuing to use nipple shield with Teresa Reeves for another 1-2 weeks before trying to wean him off shield.  Also, parents report Teresa Reeves is very gassy and seems to be in discomfort more frequently.  Few suck blisters noted on Teresa Reeves's lip.  However, larger and more obvious suck blisters noted on Teresa Reeves's upper and lower lips.  LC explained role of labial frenulum in preventing Teresa Reeves from latching with complete seal around breast so he could be taking in air which is causing the excess, uncomfortable gas.  LC discussed information regarding role of lip/tongue restriction on breastfeeding, milk stimulation, milk supply, and S&S.  Reputable web sites given for parents to look up additional information.  LC suggested if parents wish to pursue further based on S&S then to seek additional advice, consult, and assessment from professional expert in tongue-tie revisions.    Positioning:  Football tandem nursing  [employer] (smaller "B" Baby) - Left Breast; Football hold Mom latched Teresa Reeves first since he was fussing.  Teresa Reeves fed on left breast, football hold, with #16 nipple shield.  Teresa Reeves latched with minimal assistance from mom with sandwiching of breast.  LC encouraged mom to bring Teresa Reeves into  chest tighter to keep nipple shield from "popping in & out" while [employer].  When mom brought him in tighter, Teresa Reeves fed with better depth and flanged lips.  Teresa Reeves fed with nutritive, rhythmical sucking; lots swallows heard.  After 12 minutes of breastfeeding Teresa Reeves came off and went to sleep.  He transferred an initial 32 ml in 12 min.  With weighing Teresa Reeves woke up and began cuing again.  Teresa Reeves re-latched back onto left breast and fed an additional 22 minutes transferring 22 ml.  Teresa Reeves transferred a total of 54 ml at breast in 34 minutes total.  After re-dressing Teresa Reeves, he began to get fussy, so FOB gave him an additional 20 ml EBM from bottle.  LC reviewed paced-feeding method with parents.    LATCH documentation:  Latch:  2 = Grasps breast easily, tongue down, lips flanged, rhythmical sucking.  Audible swallowing:  2 = Spontaneous and intermittent  Type of nipple:  2 = Everted at rest and after stimulation  Comfort (Breast/Nipple):  2 = Soft / non-tender  Hold (Positioning):1-2 = No assistance needed to correctly position infant at breast - minor suggestions given by LC to help maintain depth  LATCH score: 9-10  Attached assessment:  Deep  Lips flanged:  Yes.  Lips untucked:  Slightly tucking top lip.    Suck assessment:  Nutritive  Tools:  #16 NS & Post-bottle fed EBM Instructed on use and cleaning of tool:  Yes.    Pre-feed weight:  2716 g  (5 lb. 15.8 oz.) Post-feed weight:  2770 g (6 lb. 1.7 oz.) Amount transferred:  54 ml Amount supplemented:  20 ml  Teresa Reeves (larger "A" Baby) - Right Breast; Football hold Mom latched Teresa Reeves second, tandem nursing on right breast, football hold.  Mom did not use nipple shield with Teresa Reeves.  Teresa Reeves independently latched with depth; nutritive, rhythmical sucking; lots swallows heard..  Minimal assistance from [facility] for flanging top lip.  Mom independently brought [facility] into chest tighter to maintain deeper latch without verbal cuing from [facility].  Lots  swallows heard.  After 9 minutes of breastfeeding Teresa Reeves came off and went to sleep.  He transferred an initial 44 ml in 9 min.  With weighing Teresa Reeves woke up and began cuing again.  Teresa Reeves re-latched back onto right breast and fed an additional 21 minutes transferring 30 ml.  Teresa Reeves transferred a total of 74 ml at breast in 30 minutes total.  Teresa Reeves was content after breastfeeding and did not need any supplementation.  [facility] taught dad how to assist at breast with latching using a teacup hold and chin tug for bottom lip since mom reported that Teresa Reeves tends to not open as wide with re-latching which she acknowledged causes a painful latch.    LATCH documentation:  Latch:  2 = Grasps breast easily, tongue down, lips flanged, rhythmical sucking.  Audible swallowing:  2 = Spontaneous and intermittent  Type of nipple:  2 = Everted at rest and after stimulation  Comfort (Breast/Nipple):  2 = Soft / non-tender  Hold (Positioning):  2 = No assistance needed to correctly position infant at breast  LATCH score:  10  Attached assessment:  Deep  Lips flanged:  Yes.    Lips untucked:  No. minimal untucking needed for top lip.    Suck assessment:  Nutritive  Pre-feed weight: 2924 g  (6 lb. 7.1 oz.) Post-feed weight:  2998 g (6 lb. 9.7 oz.) Amount transferred:  74 ml Amount supplemented:  0 ml   Total amount transferred: [employer] 54 ml / Teresa Reeves "A" 74 ml Total supplement given:  Teresa Reeves 20 ml EBM / Teresa Reeves 0 ml  Parents were very receptive to all teaching, suggestions, and were able to internalize information given to independently adjust feeding plans based on infant's behavior at breast and needs.    Plan of Care:  (written instructions and information also given to parents) 1.  Continue tandem nursing assuring depth with bringing babies into chest tighter.  Encouraged flanging top and bottom lip as needed.  2.  Continue feeding Teresa Reeves with nipple shield for another 1-2 weeks.  Reviewed weaning  techniques from nipple shield (as previously discussed at prior [facility] appointment) 3.  Continue post-pumping 4x/day to protect milk supply and for supplementation. 4.  FOB to assist as needed using teacup hold.  Taught parents to look for wide mouth like a duck and flanged lips like a fish. 5.  Based on 150-200 ml/Kg/Day recommended milk intake for infants for growth:  Teresa Reeves "A" - consumed an appropriate amount of milk at breast for his weight.  Parents reported Teresa Reeves did not take as much supplementation yesterday after feedings and with some feeding he did not take any supplementation.  Teresa Reeves "B" - based on what  Teresa Reeves consumed at breast with current feeding and recommended amounts/day, Teresa BastMason will continue needing supplementation after breastfeeding.  Teresa BastMason will need at least an additional 120 ml/ day.    Recommended continuing to supplement Teresa Reeves after each feeding 6-8 times per day 20-30 ml based on satiety. 6.  Seek additional information and expert consultation regarding lip/tongue restriction if "gassy" S&S with Teresa BastMason persist causing the infant discomfort 7.  Follow-up with Peds (November 26, 2015) in 9 days for regular check up and weight check.  Evaluate current feeding plan (supplementation frequency & amounts) based on positive weight gain at next Peds visit.   8.  Follow-up with Hospital support group between peds visits for additional weight checks to continue evaluating feeding plan adjusting plan as needed for babies' growing needs.   8.  [facility].

## 2016-02-09 ENCOUNTER — Telehealth: Payer: Self-pay | Admitting: Family Medicine

## 2016-02-09 NOTE — Telephone Encounter (Signed)
LVM inquiring if patient received flu shot  °

## 2016-06-19 IMAGING — RF DG HYSTEROGRAM
6 series · 6 of 6 positions shown · IV contrast (omnipaque)
Comparison: None.

FLUOROSCOPY TIME:  0 min 24 seconds

CLINICAL DATA: Primary infertility.

EXAM:
HYSTEROSALPINGOGRAM
TECHNIQUE: Following cleansing of the cervix and vagina with Betadine solution,
a hysterosalpingogram was performed using a 5-French
hysterosalpingogram catheter and Omnipaque 300 contrast. The patient
tolerated the examination without difficulty.

[Series 1: run · 1 of 1 slices shown (1 of 6)]
[im 1/1]
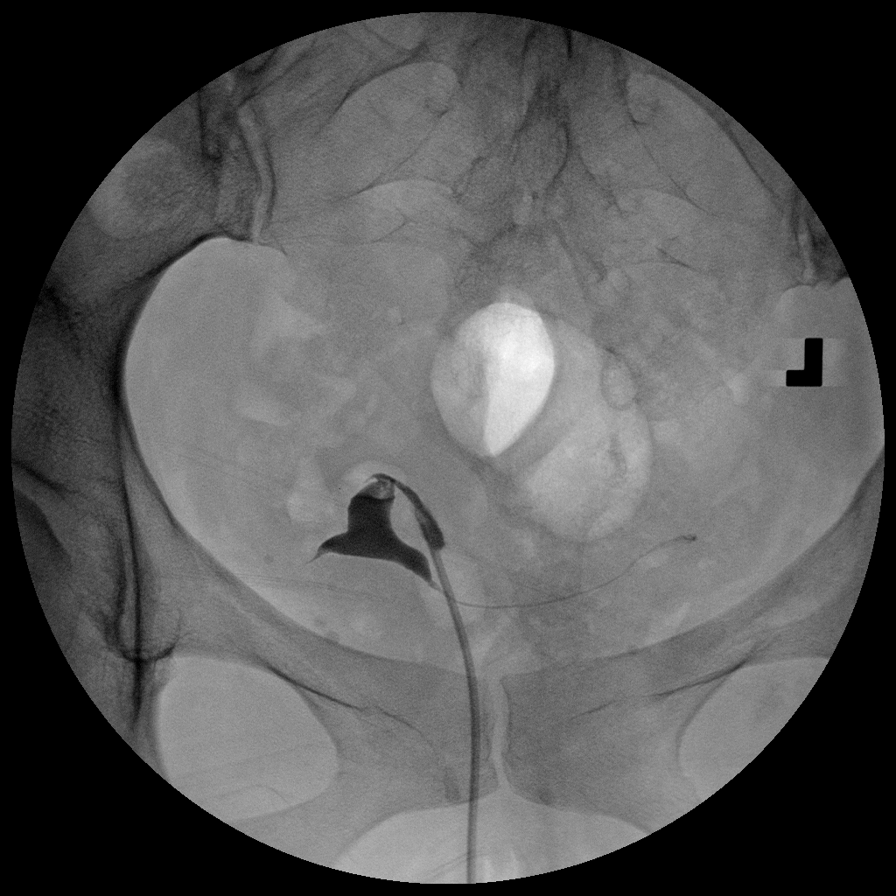

[Series 2: run · 1 of 1 slices shown (2 of 6)]
[im 1/1]
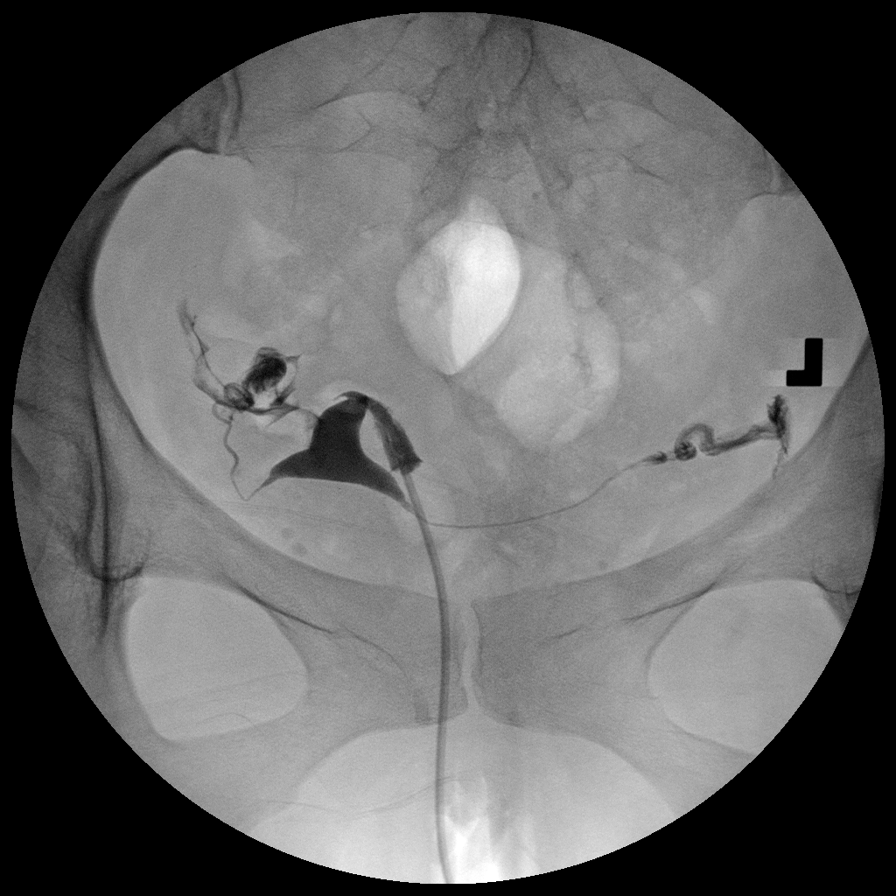

[Series 3: run · 1 of 1 slices shown (3 of 6)]
[im 1/1]
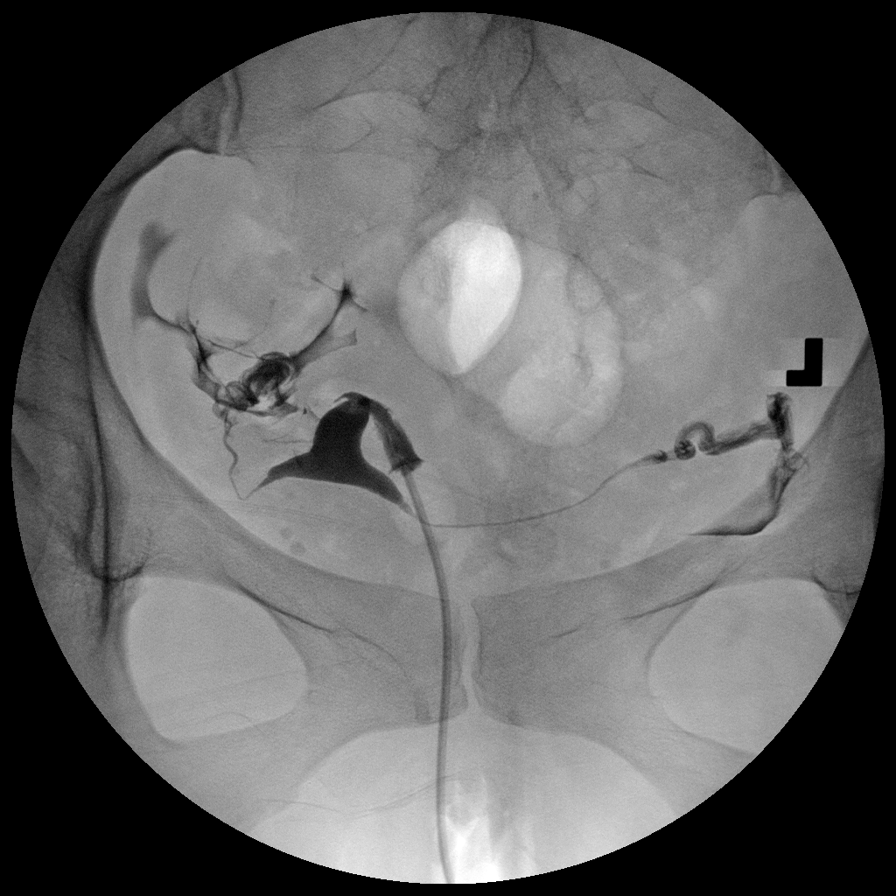

[Series 4: run · 1 of 1 slices shown (4 of 6)]
[im 1/1]
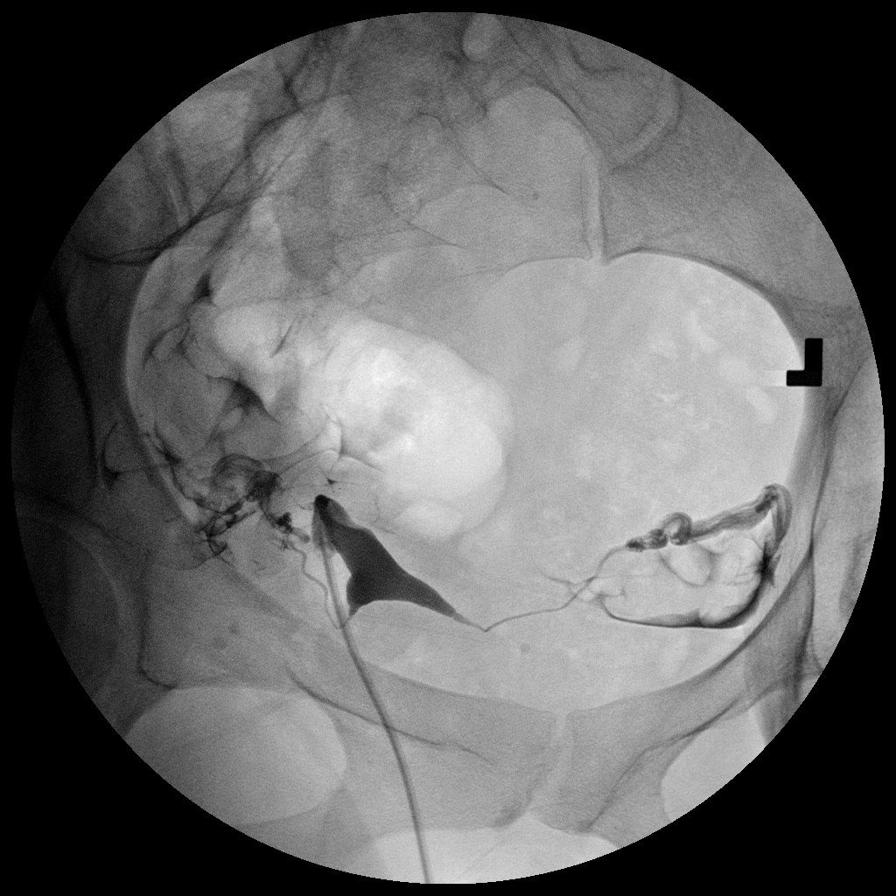

[Series 5: run · 1 of 1 slices shown (5 of 6)]
[im 1/1]
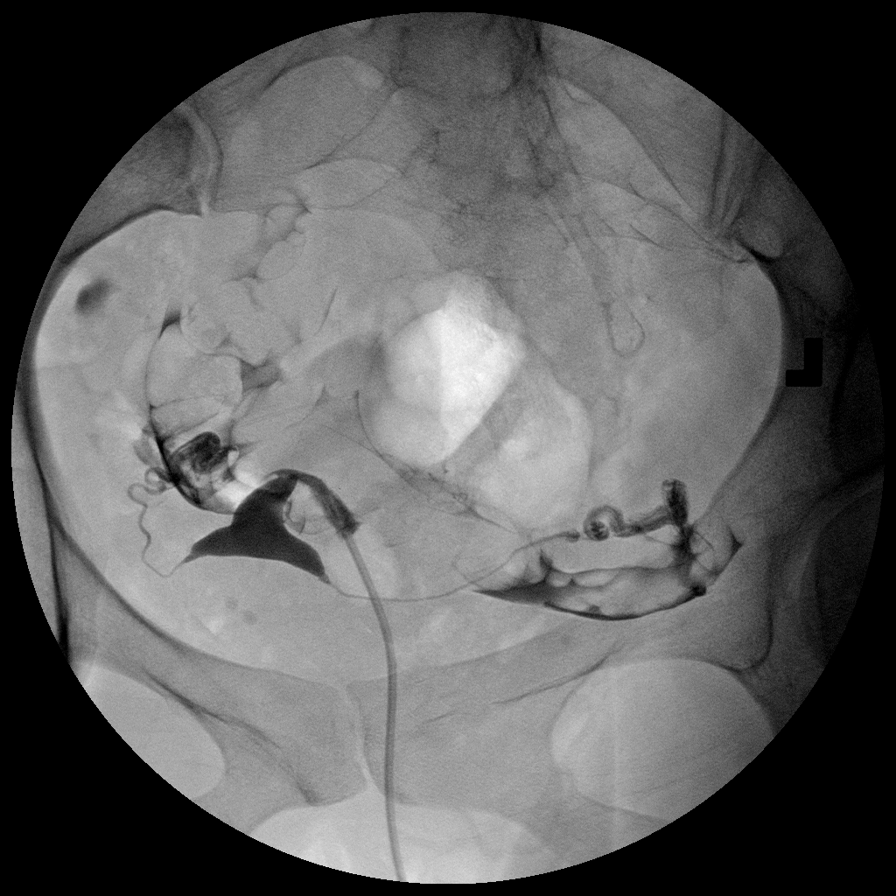

[Series 6: run · 1 of 1 slices shown (6 of 6)]
[im 1/1]
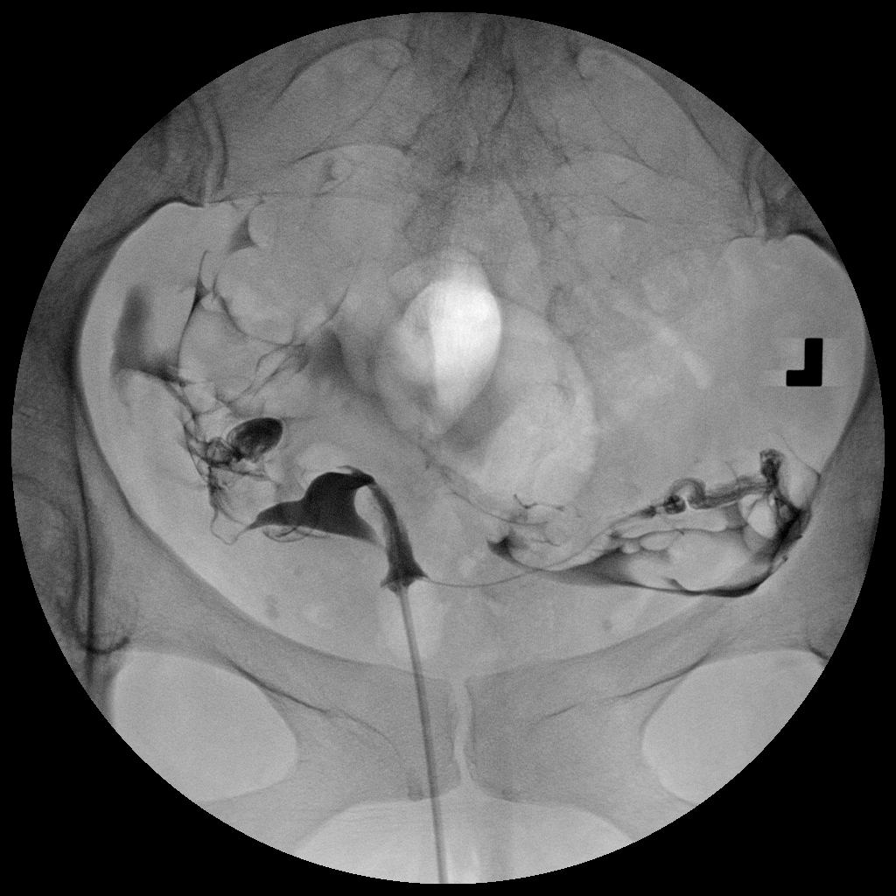

[6 of 6 positions shown; findings below may reference images not displayed]

FINDINGS: Endometrial Cavity: Anteflexed. Normal appearance. No signs of
Mullerian duct anomaly or other significant abnormality.

Right Fallopian Tube: Well opacified and normal in appearance. Free
intraperitoneal spill of contrast is demonstrated.

Left Fallopian Tube: Well opacified and normal in appearance. Free
intraperitoneal spill of contrast is demonstrated.

Other:  None.
IMPRESSION: Normal study. Both fallopian tubes are patent.

## 2022-01-11 ENCOUNTER — Other Ambulatory Visit: Payer: Self-pay
